# Patient Record
Sex: Female | Born: 1985 | Hispanic: Yes | Marital: Married | Smoking: Never smoker
Health system: Southern US, Community
[De-identification: ages and names within clinical notes are randomized; demographics above are authoritative.]

## PROBLEM LIST (undated history)

## (undated) ENCOUNTER — Inpatient Hospital Stay (HOSPITAL_COMMUNITY): Payer: Self-pay

## (undated) DIAGNOSIS — O88212 Thromboembolism in pregnancy, second trimester: Secondary | ICD-10-CM

## (undated) DIAGNOSIS — D6851 Activated protein C resistance: Secondary | ICD-10-CM

## (undated) HISTORY — DX: Activated protein C resistance: D68.51

## (undated) HISTORY — PX: NO PAST SURGERIES: SHX2092

## (undated) HISTORY — PX: INCISE AND DRAIN ABCESS: PRO64

---

## 2012-06-02 DIAGNOSIS — O88212 Thromboembolism in pregnancy, second trimester: Secondary | ICD-10-CM

## 2012-06-02 HISTORY — DX: Thromboembolism in pregnancy, second trimester: O88.212

## 2012-12-12 ENCOUNTER — Encounter (HOSPITAL_COMMUNITY): Payer: Self-pay | Admitting: *Deleted

## 2012-12-12 ENCOUNTER — Inpatient Hospital Stay (HOSPITAL_COMMUNITY)
Admission: AD | Admit: 2012-12-12 | Discharge: 2012-12-12 | Disposition: A | Discharge status: Home / Self Care | Payer: BC Managed Care – PPO | Source: Ambulatory Visit | Attending: Family Medicine | Admitting: Family Medicine

## 2012-12-12 DIAGNOSIS — O9989 Other specified diseases and conditions complicating pregnancy, childbirth and the puerperium: Secondary | ICD-10-CM

## 2012-12-12 DIAGNOSIS — O99891 Other specified diseases and conditions complicating pregnancy: Secondary | ICD-10-CM | POA: Insufficient documentation

## 2012-12-12 DIAGNOSIS — N949 Unspecified condition associated with female genital organs and menstrual cycle: Secondary | ICD-10-CM | POA: Insufficient documentation

## 2012-12-12 DIAGNOSIS — N898 Other specified noninflammatory disorders of vagina: Secondary | ICD-10-CM

## 2012-12-12 LAB — URINALYSIS, ROUTINE W REFLEX MICROSCOPIC
Nitrite: NEGATIVE
Protein, ur: NEGATIVE mg/dL
Specific Gravity, Urine: >1.03 — ABNORMAL HIGH (ref 1.005–1.030)
Urobilinogen, UA: 0.2 mg/dL (ref 0.0–1.0)

## 2012-12-12 LAB — URINE MICROSCOPIC-ADD ON

## 2012-12-12 LAB — GC/CHLAMYDIA PROBE AMP: CT Probe RNA: NEGATIVE

## 2012-12-12 NOTE — MAU Note (Signed)
Pt reports she had intercourse last night  noticed a small amount of vaginal bleeding when she wiped. Had a small amount this morning. Denies pain or cramping.

## 2012-12-12 NOTE — MAU Provider Note (Signed)
History     CSN: [id number]  Arrival date and time: 12/12/12 4098   None     Chief Complaint  Patient presents with  . Vaginal Bleeding   HPI 27 y.o. G1P0 at [redacted]w[redacted]d with vaginal discharge. Originally stated to interpreter that she had "spotting" last night after intercourse and again with wiping this morning, small amount of low back pain. When I interviewed her with interpreter, however, she denies any bleeding, just a small amount of "watery discharge" after intercourse last night and this morning. Asked multiple times and patient denies any bleeding. Was seen by [facility] on Friday for vaginal discharge, had a negative wet prep at that time, but did not have a GC/CT. This is where she is getting prenatal care.   Past Medical History  Diagnosis Date  . Medical history non-contributory     Past Surgical History  Procedure Laterality Date  . No past surgeries      No family history on file.  History  Substance Use Topics  . Smoking status: Never Smoker   . Smokeless tobacco: Not on file  . Alcohol Use: No    Allergies: No Known Allergies  No prescriptions prior to admission    Review of Systems  Constitutional: Negative.   Respiratory: Negative.   Cardiovascular: Negative.   Gastrointestinal: Negative for nausea, vomiting, abdominal pain, diarrhea and constipation.  Genitourinary: Negative for dysuria, urgency, frequency, hematuria and flank pain.       Negative for vaginal bleeding, + discharge   Musculoskeletal: Negative.   Neurological: Negative.   Psychiatric/Behavioral: Negative.    Physical Exam   Blood pressure 102/53, pulse 81, temperature 97 F (36.1 C), temperature source Oral, resp. rate 18, height 5\' 3"  (1.6 m), weight 117 lb 12.8 oz (53.434 kg), last menstrual period 09/12/2012.  Physical Exam  Nursing note and vitals reviewed. Constitutional: She is oriented to person, place, and time. She appears well-developed and  well-nourished. No distress.  Cardiovascular: Normal rate.   Respiratory: Effort normal.  GI: Soft. There is no tenderness.  Genitourinary: Vaginal discharge (light brown mucous) found.  Cervix closed   Musculoskeletal: Normal range of motion.  Neurological: She is alert and oriented to person, place, and time.  Skin: Skin is warm and dry.  Psychiatric: She has a normal mood and affect.    MAU Course  Procedures  Results for orders placed during the hospital encounter of 12/12/12 (from the past 24 hour(s))  URINALYSIS, ROUTINE W REFLEX MICROSCOPIC     Status: Abnormal   Collection Time    12/12/12  7:20 AM      Result Value Range   Color, Urine YELLOW  YELLOW   APPearance CLEAR  CLEAR   Specific Gravity, Urine >1.030 (*) 1.005 - 1.030   pH 5.5  5.0 - 8.0   Glucose, UA NEGATIVE  NEGATIVE mg/dL   Hgb urine dipstick LARGE (*) NEGATIVE   Bilirubin Urine NEGATIVE  NEGATIVE   Ketones, ur NEGATIVE  NEGATIVE mg/dL   Protein, ur NEGATIVE  NEGATIVE mg/dL   Urobilinogen, UA 0.2  0.0 - 1.0 mg/dL   Nitrite NEGATIVE  NEGATIVE   Leukocytes, UA SMALL (*) NEGATIVE  URINE MICROSCOPIC-ADD ON     Status: Abnormal   Collection Time    12/12/12  7:20 AM      Result Value Range   Squamous Epithelial / LPF MANY (*) RARE   WBC, UA 3-6  <3 WBC/hpf   RBC / HPF  0-2  <3 RBC/hpf   Bacteria, UA FEW (*) RARE   Crystals CA OXALATE CRYSTALS (*) NEGATIVE   Urine-Other MUCOUS PRESENT      Assessment and Plan   1. Vaginal discharge in pregnancy in second trimester       Medication List         prenatal multivitamin Tabs  Take 1 tablet by mouth daily at 12 noon.            Follow-up Information   Follow up with [facility]. (as scheduled for prenatal care)    Contact information:   [address] [place] [address] [phone]        FRAZIER,NATALIE 12/12/2012, 8:28 AM

## 2012-12-13 LAB — URINE CULTURE: Colony Count: 40000

## 2013-01-19 ENCOUNTER — Other Ambulatory Visit: Payer: Self-pay

## 2013-03-17 ENCOUNTER — Other Ambulatory Visit (HOSPITAL_COMMUNITY): Payer: Self-pay | Admitting: Obstetrics and Gynecology

## 2013-03-17 DIAGNOSIS — O09529 Supervision of elderly multigravida, unspecified trimester: Secondary | ICD-10-CM

## 2013-03-18 ENCOUNTER — Ambulatory Visit (HOSPITAL_COMMUNITY)
Admission: RE | Admit: 2013-03-18 | Discharge: 2013-03-18 | Disposition: A | Discharge status: Home / Self Care | Payer: BC Managed Care – PPO | Source: Ambulatory Visit | Attending: Obstetrics and Gynecology | Admitting: Obstetrics and Gynecology

## 2013-03-18 ENCOUNTER — Encounter (HOSPITAL_COMMUNITY): Payer: Self-pay

## 2013-03-18 DIAGNOSIS — O358XX0 Maternal care for other (suspected) fetal abnormality and damage, not applicable or unspecified: Secondary | ICD-10-CM | POA: Insufficient documentation

## 2013-03-18 DIAGNOSIS — O2230 Deep phlebothrombosis in pregnancy, unspecified trimester: Secondary | ICD-10-CM | POA: Insufficient documentation

## 2013-03-18 DIAGNOSIS — I82409 Acute embolism and thrombosis of unspecified deep veins of unspecified lower extremity: Secondary | ICD-10-CM | POA: Insufficient documentation

## 2013-03-18 DIAGNOSIS — O2232 Deep phlebothrombosis in pregnancy, second trimester: Secondary | ICD-10-CM

## 2013-03-18 NOTE — Progress Notes (Signed)
MATERNAL FETAL MEDICINE CONSULT  Patient Name: Sherry Houston Medical Record Number:  [id number] Date of Birth: 03/21/1986 Requesting Physician Name:  Dahlia Bailiff, MD Date of Service: 03/18/2013  Chief Complaint DVT  History of Present Illness Sherry Houston was seen today for prenatal diagnosis secondary to DVT at the request of Dr. Dahlia Bailiff.  The patient is a 27 y.o. G1P0,at [redacted]w[redacted]d per second trimester ultrasound.  Sherry Houston was diagnosed with a Left lower extremity DVT on 9/30 after having a several day history of pain and swelling in that extremity.  She had Doppler studies and was diagnosed with a DVT and started on therapeutic lovenox.  (1 mg/kg q 12 hrs- currently on 60 mg each dose).  She comes in today for a consultation about DVT in pregnancy. She reports continued pain in her left leg that is less than previously and more sore.  Review of Systems Pertinent items are noted in HPI.  Patient History OB History  Gravida Para Term Preterm AB SAB TAB Ectopic Multiple Living  1             # Outcome Date GA Lbr Len/2nd Weight Sex Delivery Anes PTL Lv  1 CUR               History reviewed. No pertinent past medical history.  History reviewed. No pertinent past surgical history.  History   Social History  . Marital Status: Married    Spouse Name: N/A    Number of Children: N/A  . Years of Education: N/A   Social History Main Topics  . Smoking status: Never Smoker   . Smokeless tobacco: None  . Alcohol Use: No  . Drug Use: No  . Sexual Activity: Yes   Other Topics Concern  . None   Social History Narrative  . None    History reviewed. No pertinent family history.No family history of DVT. In addition, the patient has no family history of mental retardation, birth defects, or genetic diseases.  Physical Examination There were no vitals filed for this visit. General appearance - alert, well appearing, and in no distress  Assessment and Recommendations 1.  DVT-  Sherry Houston was counseled that she will need to be on therapeutic anticoagulation at least throughout her pregnancy and the following 6 weeks postpartum.  She is currently on Lovenox.  She will need a timed delivery/induction of labor at 39 weeks so as to be able to transition her from the lovenox (needs to be discontinued 24 hrs prior to labor) to a heparin drip until she gets to ~4cm, active labor at which point the drip can be turned off (desired to be off 4-6 hours prior to delivery, A neuraxial catheter may be placed when the aPTT has returned to normal).  Anticoagulation will need to be resumed 6 hours after a vaginal delivery, 12 hours after a cesarean.  Ted hose should be worn during labor.  Sherry Houston should have a third trimester Anesthesia consultation as well.   In the meantime, Sherry Houston should have a workup for inherited/acquired thrombophilias to include: Factor V Leiden, antithrombin, prothrombin gene mutations, lupus anticoagulant, anticardiolipin antibodies, and beta 2 glycoprotein.    Lovenox dosing should be monitored to assure therapeutic levels are maintained.  Anti Xa levels should be drawn 4 hrs after the 4th dose of lovenox and then monthly thereafter.  A therapeutic level is between 0.5-1.2.  Sherry Houston mentioned possibly being cared for with her prenatal care with high risk  MFM at [facility].  If this is your desire then she could transfer to the faculty practice at [facility] or if you'd like MFM care for her she would be welcome at the [facility] MFM office in [place].  We are also very happy to assist you in management of her care should that be the plan.    Thank you for this opportunity to consult on your patient. This plan was discussed with Dr. Derinda Sis who is in agreement with the above.   45 minutes was spent with the patient >50% of which was devoted to face to face counseling on the above.   Molly Maduro, MD MFM Fellow

## 2013-03-18 NOTE — Addendum Note (Signed)
Encounter addended by: Alessandra Bevels. Chase Picket, RN on: 03/18/2013  5:33 PM<BR>     Documentation filed: Episodes

## 2013-03-21 ENCOUNTER — Ambulatory Visit (HOSPITAL_COMMUNITY)
Admission: RE | Admit: 2013-03-21 | Discharge: 2013-03-21 | Disposition: A | Discharge status: Home / Self Care | Payer: BC Managed Care – PPO | Source: Ambulatory Visit | Attending: Obstetrics and Gynecology | Admitting: Obstetrics and Gynecology

## 2013-03-21 VITALS — BP 128/76 | HR 125 | Wt 134.0 lb

## 2013-03-21 DIAGNOSIS — Z1389 Encounter for screening for other disorder: Secondary | ICD-10-CM | POA: Insufficient documentation

## 2013-03-21 DIAGNOSIS — O09529 Supervision of elderly multigravida, unspecified trimester: Secondary | ICD-10-CM

## 2013-03-21 DIAGNOSIS — O2232 Deep phlebothrombosis in pregnancy, second trimester: Secondary | ICD-10-CM

## 2013-03-21 DIAGNOSIS — Z363 Encounter for antenatal screening for malformations: Secondary | ICD-10-CM | POA: Insufficient documentation

## 2013-03-21 DIAGNOSIS — I82409 Acute embolism and thrombosis of unspecified deep veins of unspecified lower extremity: Secondary | ICD-10-CM | POA: Insufficient documentation

## 2013-03-21 DIAGNOSIS — O358XX0 Maternal care for other (suspected) fetal abnormality and damage, not applicable or unspecified: Secondary | ICD-10-CM | POA: Insufficient documentation

## 2013-03-21 NOTE — Progress Notes (Signed)
Sherry Houston  was seen today for an ultrasound appointment.  See full report in [facility].  Impression: Single IUP at 25 1/7 weeks Recent diagnosis of DVT Normal fetal anatomic survey Fetal growth is appropriate (64th %tile) Normal amniotic fluid volume  Recommendations: Recommend follow-up ultrasound examination in 4 weeks for interval growth Recommend monthyl anti-Xa levels while on therapeutic Lovinox.  Alpha Gula, MD

## 2013-04-05 NOTE — Addendum Note (Signed)
Encounter addended by: Alessandra Bevels. Chase Picket, RN on: 04/05/2013 10:55 AM<BR>     Documentation filed: Charges VN

## 2013-04-11 ENCOUNTER — Encounter (HOSPITAL_COMMUNITY): Payer: Self-pay | Admitting: *Deleted

## 2013-04-20 ENCOUNTER — Other Ambulatory Visit (HOSPITAL_COMMUNITY): Payer: Self-pay | Admitting: Obstetrics and Gynecology

## 2013-04-20 ENCOUNTER — Encounter (HOSPITAL_COMMUNITY): Payer: Self-pay

## 2013-04-20 ENCOUNTER — Ambulatory Visit (HOSPITAL_COMMUNITY)
Admission: RE | Admit: 2013-04-20 | Discharge: 2013-04-20 | Disposition: A | Discharge status: Home / Self Care | Payer: BC Managed Care – PPO | Source: Ambulatory Visit | Attending: Obstetrics and Gynecology | Admitting: Obstetrics and Gynecology

## 2013-04-20 DIAGNOSIS — I82409 Acute embolism and thrombosis of unspecified deep veins of unspecified lower extremity: Secondary | ICD-10-CM | POA: Insufficient documentation

## 2013-04-20 DIAGNOSIS — O2232 Deep phlebothrombosis in pregnancy, second trimester: Secondary | ICD-10-CM

## 2013-04-20 DIAGNOSIS — O09529 Supervision of elderly multigravida, unspecified trimester: Secondary | ICD-10-CM

## 2013-05-18 ENCOUNTER — Encounter (HOSPITAL_COMMUNITY): Payer: Self-pay

## 2013-05-18 ENCOUNTER — Ambulatory Visit (HOSPITAL_COMMUNITY)
Admission: RE | Admit: 2013-05-18 | Discharge: 2013-05-18 | Disposition: A | Discharge status: Home / Self Care | Payer: BC Managed Care – PPO | Source: Ambulatory Visit | Attending: Obstetrics and Gynecology | Admitting: Obstetrics and Gynecology

## 2013-05-18 ENCOUNTER — Other Ambulatory Visit (HOSPITAL_COMMUNITY): Payer: Self-pay | Admitting: Obstetrics and Gynecology

## 2013-05-18 VITALS — BP 114/52 | HR 110 | Wt 149.0 lb

## 2013-05-18 DIAGNOSIS — I82409 Acute embolism and thrombosis of unspecified deep veins of unspecified lower extremity: Secondary | ICD-10-CM | POA: Insufficient documentation

## 2013-05-18 DIAGNOSIS — O2233 Deep phlebothrombosis in pregnancy, third trimester: Secondary | ICD-10-CM

## 2013-05-19 ENCOUNTER — Other Ambulatory Visit (HOSPITAL_COMMUNITY): Payer: Self-pay | Admitting: Obstetrics and Gynecology

## 2013-05-19 DIAGNOSIS — I82409 Acute embolism and thrombosis of unspecified deep veins of unspecified lower extremity: Secondary | ICD-10-CM

## 2013-05-25 ENCOUNTER — Ambulatory Visit (HOSPITAL_COMMUNITY): Payer: BC Managed Care – PPO

## 2013-05-25 ENCOUNTER — Ambulatory Visit (HOSPITAL_COMMUNITY)
Admission: RE | Admit: 2013-05-25 | Discharge: 2013-05-25 | Disposition: A | Discharge status: Home / Self Care | Payer: BC Managed Care – PPO | Source: Ambulatory Visit | Attending: Obstetrics and Gynecology | Admitting: Obstetrics and Gynecology

## 2013-05-25 DIAGNOSIS — I82409 Acute embolism and thrombosis of unspecified deep veins of unspecified lower extremity: Secondary | ICD-10-CM | POA: Insufficient documentation

## 2013-05-31 ENCOUNTER — Other Ambulatory Visit: Payer: Self-pay | Admitting: Obstetrics and Gynecology

## 2013-05-31 DIAGNOSIS — I82409 Acute embolism and thrombosis of unspecified deep veins of unspecified lower extremity: Secondary | ICD-10-CM

## 2013-06-01 ENCOUNTER — Other Ambulatory Visit (HOSPITAL_COMMUNITY): Payer: Self-pay | Admitting: Obstetrics and Gynecology

## 2013-06-01 ENCOUNTER — Other Ambulatory Visit: Payer: Self-pay | Admitting: Obstetrics and Gynecology

## 2013-06-01 ENCOUNTER — Ambulatory Visit (HOSPITAL_COMMUNITY)
Admission: RE | Admit: 2013-06-01 | Discharge: 2013-06-01 | Disposition: A | Discharge status: Home / Self Care | Payer: BC Managed Care – PPO | Source: Ambulatory Visit | Attending: Obstetrics and Gynecology | Admitting: Obstetrics and Gynecology

## 2013-06-01 ENCOUNTER — Encounter (HOSPITAL_COMMUNITY): Payer: Self-pay

## 2013-06-01 VITALS — BP 110/67 | HR 102 | Wt 152.5 lb

## 2013-06-01 DIAGNOSIS — O2231 Deep phlebothrombosis in pregnancy, first trimester: Secondary | ICD-10-CM

## 2013-06-01 DIAGNOSIS — I82409 Acute embolism and thrombosis of unspecified deep veins of unspecified lower extremity: Secondary | ICD-10-CM

## 2013-06-08 ENCOUNTER — Other Ambulatory Visit (HOSPITAL_COMMUNITY): Payer: Self-pay | Admitting: Obstetrics and Gynecology

## 2013-06-08 ENCOUNTER — Ambulatory Visit (HOSPITAL_COMMUNITY)
Admission: RE | Admit: 2013-06-08 | Discharge: 2013-06-08 | Disposition: A | Discharge status: Home / Self Care | Payer: BC Managed Care – PPO | Source: Ambulatory Visit | Attending: Obstetrics and Gynecology | Admitting: Obstetrics and Gynecology

## 2013-06-08 DIAGNOSIS — O2230 Deep phlebothrombosis in pregnancy, unspecified trimester: Principal | ICD-10-CM

## 2013-06-08 DIAGNOSIS — I82409 Acute embolism and thrombosis of unspecified deep veins of unspecified lower extremity: Secondary | ICD-10-CM

## 2013-06-15 ENCOUNTER — Ambulatory Visit (HOSPITAL_COMMUNITY)
Admission: RE | Admit: 2013-06-15 | Discharge: 2013-06-15 | Disposition: A | Discharge status: Home / Self Care | Payer: BC Managed Care – PPO | Source: Ambulatory Visit | Attending: Obstetrics and Gynecology | Admitting: Obstetrics and Gynecology

## 2013-06-15 DIAGNOSIS — O2230 Deep phlebothrombosis in pregnancy, unspecified trimester: Principal | ICD-10-CM

## 2013-06-15 DIAGNOSIS — I82409 Acute embolism and thrombosis of unspecified deep veins of unspecified lower extremity: Secondary | ICD-10-CM

## 2013-06-22 ENCOUNTER — Ambulatory Visit (HOSPITAL_COMMUNITY)
Admission: RE | Admit: 2013-06-22 | Discharge: 2013-06-22 | Disposition: A | Discharge status: Home / Self Care | Payer: BC Managed Care – PPO | Source: Ambulatory Visit | Attending: Obstetrics and Gynecology | Admitting: Obstetrics and Gynecology

## 2013-06-22 DIAGNOSIS — I82409 Acute embolism and thrombosis of unspecified deep veins of unspecified lower extremity: Secondary | ICD-10-CM | POA: Insufficient documentation

## 2013-06-22 DIAGNOSIS — Z3689 Encounter for other specified antenatal screening: Secondary | ICD-10-CM | POA: Insufficient documentation

## 2013-06-22 DIAGNOSIS — O2230 Deep phlebothrombosis in pregnancy, unspecified trimester: Principal | ICD-10-CM

## 2013-09-16 ENCOUNTER — Inpatient Hospital Stay (HOSPITAL_COMMUNITY)
Admission: AD | Admit: 2013-09-16 | Discharge: 2013-09-16 | Disposition: A | Discharge status: Home / Self Care | Payer: BC Managed Care – PPO | Source: Ambulatory Visit | Attending: Family Medicine | Admitting: Family Medicine

## 2013-09-16 ENCOUNTER — Encounter (HOSPITAL_COMMUNITY): Payer: Self-pay | Admitting: *Deleted

## 2013-09-16 DIAGNOSIS — O9113 Abscess of breast associated with lactation: Secondary | ICD-10-CM

## 2013-09-16 DIAGNOSIS — O9112 Abscess of breast associated with the puerperium: Secondary | ICD-10-CM | POA: Insufficient documentation

## 2013-09-16 LAB — CBC
HCT: 37.8 % (ref 36.0–46.0)
Hemoglobin: 12.6 g/dL (ref 12.0–15.0)
MCH: 27.3 pg (ref 26.0–34.0)
MCHC: 33.3 g/dL (ref 30.0–36.0)
MCV: 82 fL (ref 78.0–100.0)
PLATELETS: 333 10*3/uL (ref 150–400)
RBC: 4.61 MIL/uL (ref 3.87–5.11)
RDW: 14.6 % (ref 11.5–15.5)
WBC: 12.8 10*3/uL — ABNORMAL HIGH (ref 4.0–10.5)

## 2013-09-16 LAB — URINALYSIS, ROUTINE W REFLEX MICROSCOPIC
Bilirubin Urine: NEGATIVE
GLUCOSE, UA: NEGATIVE mg/dL
KETONES UR: NEGATIVE mg/dL
Nitrite: NEGATIVE
PH: 5.5 (ref 5.0–8.0)
Protein, ur: NEGATIVE mg/dL
Specific Gravity, Urine: <1.005 — ABNORMAL LOW (ref 1.005–1.030)
Urobilinogen, UA: 0.2 mg/dL (ref 0.0–1.0)

## 2013-09-16 LAB — POCT PREGNANCY, URINE: Preg Test, Ur: NEGATIVE

## 2013-09-16 LAB — URINE MICROSCOPIC-ADD ON

## 2013-09-16 MED ORDER — SODIUM CHLORIDE 0.9 % IV SOLN
INTRAVENOUS | Status: DC
  Administered 2013-09-16: 22:00:00 via INTRAVENOUS

## 2013-09-16 MED ORDER — CLINDAMYCIN PHOSPHATE 900 MG/50ML IV SOLN
900.0000 mg | Freq: Once | INTRAVENOUS | Status: AC
  Administered 2013-09-16: 900 mg via INTRAVENOUS
  Filled 2013-09-16: qty 50

## 2013-09-16 MED ORDER — OXYCODONE-ACETAMINOPHEN 5-325 MG PO TABS: 1.0000 | ORAL_TABLET | ORAL | Status: DC | PRN

## 2013-09-16 MED ORDER — FENTANYL CITRATE 0.05 MG/ML IJ SOLN
100.0000 ug | INTRAMUSCULAR | Status: DC | PRN
Start: 2013-09-16 — End: 2013-09-17
  Administered 2013-09-16: 100 ug via INTRAVENOUS

## 2013-09-16 MED ORDER — FENTANYL CITRATE 0.05 MG/ML IJ SOLN
INTRAMUSCULAR | Status: AC
  Filled 2013-09-16: qty 2

## 2013-09-16 MED ORDER — CLINDAMYCIN HCL 300 MG PO CAPS: 300.0000 mg | ORAL_CAPSULE | Freq: Four times a day (QID) | ORAL | Status: AC

## 2013-09-16 NOTE — MAU Provider Note (Signed)
Chief Complaint: No chief complaint on file.  First Provider Initiated Contact with Patient 09/16/13 2050     SUBJECTIVE HPI: Sherry Houston is a 28 y.o. G1P1001 at 2.5 months PP who presents with a painful right breast mass. Sx started ~1 month ago when baby went on a nursing strike. Pt has been trying to pump, but has not been able to get an y milk out of the right breast. 1 week ago pain worsenign and the pt started having body aches and chills. Didn't check temp. Saw OB in [place]. Rx Keflex 750 PO TID x 10 days. Has taken as directed. Body aches, chills resolved but breast is no better.   Took Ibuprofen 800 mg at 1630.   Past Medical History  Diagnosis Date  . Medical history non-contributory    OB History  Gravida Para Term Preterm AB SAB TAB Ectopic Multiple Living  1 1 1       1     # Outcome Date GA Lbr Len/2nd Weight Sex Delivery Anes PTL Lv  1 TRM 07/01/13    F SVD Local  Y     Comments: System Generated. Please review and update pregnancy details.     Past Surgical History  Procedure Laterality Date  . No past surgeries     History   Social History  . Marital Status: Married    Spouse Name: N/A    Number of Children: N/A  . Years of Education: N/A   Occupational History  . Not on file.   Social History Main Topics  . Smoking status: Never Smoker   . Smokeless tobacco: Never Used  . Alcohol Use: No  . Drug Use: No  . Sexual Activity: Yes    Birth Control/ Protection: Condom   Other Topics Concern  . Not on file   Social History Narrative   ** Merged History Encounter **       No current facility-administered medications on file prior to encounter.   Current Outpatient Prescriptions on File Prior to Encounter  Medication Sig Dispense Refill  . Prenatal Vit-Fe Fumarate-FA (PRENATAL MULTIVITAMIN) TABS Take 1 tablet by mouth daily at 12 noon.      . enoxaparin (LOVENOX) 60 MG/0.6ML injection Inject 60 mg into the skin every 12 (twelve) hours.       No  Known Allergies  ROS: Pertinent items in HPI.   OBJECTIVE Blood pressure 107/48, pulse 66, temperature 98.2 F (36.8 C), temperature source Oral, resp. rate 18, height 5' 1.5" (1.562 m), weight 54.522 kg (120 lb 3.2 oz), last menstrual period 09/12/2012, SpO2 100.00%, unknown if currently breastfeeding. GENERAL: Well-developed, well-nourished female in no acute distress.  HEENT: Normocephalic HEART: normal rate RESP: normal effort BREAST: Right breast w/ 8x10 cm erythematous, tender, fluctuant mass from 10 o'clock to 2 o'clock. No drainage or bleeding.  NEURO: Alert and oriented  LAB RESULTS Results for orders placed during the hospital encounter of 09/16/13 (from the past 24 hour(s))  URINALYSIS, ROUTINE W REFLEX MICROSCOPIC     Status: Abnormal   Collection Time    09/16/13  7:37 PM      Result Value Ref Range   Color, Urine STRAW (*) YELLOW   APPearance HAZY (*) CLEAR   Specific Gravity, Urine <1.005 (*) 1.005 - 1.030   pH 5.5  5.0 - 8.0   Glucose, UA NEGATIVE  NEGATIVE mg/dL   Hgb urine dipstick SMALL (*) NEGATIVE   Bilirubin Urine NEGATIVE  NEGATIVE   Ketones,  ur NEGATIVE  NEGATIVE mg/dL   Protein, ur NEGATIVE  NEGATIVE mg/dL   Urobilinogen, UA 0.2  0.0 - 1.0 mg/dL   Nitrite NEGATIVE  NEGATIVE   Leukocytes, UA MODERATE (*) NEGATIVE  URINE MICROSCOPIC-ADD ON     Status: Abnormal   Collection Time    09/16/13  7:37 PM      Result Value Ref Range   Squamous Epithelial / LPF FEW (*) RARE   WBC, UA 11-20  <3 WBC/hpf   RBC / HPF 0-2  <3 RBC/hpf   Bacteria, UA FEW (*) RARE  POCT PREGNANCY, URINE     Status: None   Collection Time    09/16/13  7:48 PM      Result Value Ref Range   Preg Test, Ur NEGATIVE  NEGATIVE  CBC     Status: Abnormal   Collection Time    09/16/13  8:22 PM      Result Value Ref Range   WBC 12.8 (*) 4.0 - 10.5 K/uL   RBC 4.61  3.87 - 5.11 MIL/uL   Hemoglobin 12.6  12.0 - 15.0 g/dL   HCT 04.537.8  40.936.0 - 81.146.0 %   MCV 82.0  78.0 - 100.0 fL   MCH 27.3   26.0 - 34.0 pg   MCHC 33.3  30.0 - 36.0 g/dL   RDW 91.414.6  78.211.5 - 95.615.5 %   Platelets 333  150 - 400 K/uL   IMAGING No results found.  MAU COURSE Consulted w/ Dr. Shawnie PonsPratt RE: concern for abscess. Agrees. Consulted General Surgeon Dr. Luisa Hartornett. Will come see pt in MAU. Ordered Clinda IV.   I and D performed by Dr. Luisa Hartornett. See note.   ASSESSMENT 1. Abscess of breast associated with lactation     PLAN Discharge home in stable condition.  After care instructions reviewed.      Follow-up Information   Follow up with [facility] On 09/20/2013. (will call you to schedule appointment)    Contact information:   [address] [place] [address] [phone]      Follow up with [facility]. (As needed in emergencies (fever >100.4, severe pain or heavy bleeding))    Contact information:   [address] [place] [id number]        Medication List    STOP taking these medications       cephALEXin 750 MG capsule  Commonly known as:  KEFLEX     enoxaparin 60 MG/0.6ML injection  Commonly known as:  LOVENOX      TAKE these medications       clindamycin 300 MG capsule  Commonly known as:  CLEOCIN  Take 1 capsule (300 mg total) by mouth 4 (four) times daily.     ibuprofen 800 MG tablet  Commonly known as:  ADVIL,MOTRIN  Take 800 mg by mouth every 8 (eight) hours as needed.     oxyCODONE-acetaminophen 5-325 MG per tablet  Commonly known as:  PERCOCET/ROXICET  Take 1-2 tablets by mouth every 4 (four) hours as needed.     prenatal multivitamin Tabs tablet  Take 1 tablet by mouth daily at 12 noon.         [place] Merrillyn Ackerley, [place] 09/16/2013  10:31 PM

## 2013-09-16 NOTE — MAU Note (Signed)
About 1.425months after baby was born baby started rejecting breast milk and my right breast is hard. Last Weds i saw my ob/gyn and given antibiotics. Right breast hurts, is red and swollen. Baby is not breastfeeding anymore but i am pumping but no milk is coming out and breasts are firm. Hx L DVT in 5th month of pregnancy. Took Lovenox until 40 days after delivery and now everything ok with leg

## 2013-09-16 NOTE — Consult Note (Signed)
Reason for Consult:right breast abscess  Referring Physician: Shawnie PonsPratt MD  Sherry FeeEsther Solis Houston is an 28 y.o. female.  HPI: Asked to see pt for right breast abscess for 8 days.  Pt is breast feeding intermittently.  Pt has redness,  Warmth and tenderness of right breast at nipple.  She has been on antibiotics.  Not better. Translator used to communicate  For spanish.   Past Medical History  Diagnosis Date  . Medical history non-contributory     Past Surgical History  Procedure Laterality Date  . No past surgeries      History reviewed. No pertinent family history.  Social History:  reports that she has never smoked. She has never used smokeless tobacco. She reports that she does not drink alcohol or use illicit drugs.  Allergies: No Known Allergies  Medications: I have reviewed the patient's current medications.  Results for orders placed during the hospital encounter of 09/16/13 (from the past 48 hour(s))  URINALYSIS, ROUTINE W REFLEX MICROSCOPIC     Status: Abnormal   Collection Time    09/16/13  7:37 PM      Result Value Ref Range   Color, Urine STRAW (*) YELLOW   APPearance HAZY (*) CLEAR   Specific Gravity, Urine <1.005 (*) 1.005 - 1.030   pH 5.5  5.0 - 8.0   Glucose, UA NEGATIVE  NEGATIVE mg/dL   Hgb urine dipstick SMALL (*) NEGATIVE   Bilirubin Urine NEGATIVE  NEGATIVE   Ketones, ur NEGATIVE  NEGATIVE mg/dL   Protein, ur NEGATIVE  NEGATIVE mg/dL   Urobilinogen, UA 0.2  0.0 - 1.0 mg/dL   Nitrite NEGATIVE  NEGATIVE   Leukocytes, UA MODERATE (*) NEGATIVE  URINE MICROSCOPIC-ADD ON     Status: Abnormal   Collection Time    09/16/13  7:37 PM      Result Value Ref Range   Squamous Epithelial / LPF FEW (*) RARE   WBC, UA 11-20  <3 WBC/hpf   RBC / HPF 0-2  <3 RBC/hpf   Bacteria, UA FEW (*) RARE  POCT PREGNANCY, URINE     Status: None   Collection Time    09/16/13  7:48 PM      Result Value Ref Range   Preg Test, Ur NEGATIVE  NEGATIVE   Comment:            THE  SENSITIVITY OF THIS     METHODOLOGY IS >24 mIU/mL  CBC     Status: Abnormal   Collection Time    09/16/13  8:22 PM      Result Value Ref Range   WBC 12.8 (*) 4.0 - 10.5 K/uL   RBC 4.61  3.87 - 5.11 MIL/uL   Hemoglobin 12.6  12.0 - 15.0 g/dL   HCT 21.337.8  08.636.0 - 57.846.0 %   MCV 82.0  78.0 - 100.0 fL   MCH 27.3  26.0 - 34.0 pg   MCHC 33.3  30.0 - 36.0 g/dL   RDW 46.914.6  62.911.5 - 52.815.5 %   Platelets 333  150 - 400 K/uL    No results found.  Review of Systems  Constitutional: Negative for fever and chills.  Eyes: Negative.   Respiratory: Negative.   Cardiovascular: Negative.   Gastrointestinal: Negative.   Genitourinary: Negative.   Musculoskeletal: Negative.   Skin: Negative.   Endo/Heme/Allergies: Negative.   Psychiatric/Behavioral: Negative.    Blood pressure 107/48, pulse 66, temperature 98.2 F (36.8 C), temperature source Oral, resp. rate 18, height 5'  1.5" (1.562 m), weight 120 lb 3.2 oz (54.522 kg), last menstrual period 09/12/2012, SpO2 100.00%, unknown if currently breastfeeding. Physical Exam  Constitutional: She is oriented to person, place, and time. She appears well-developed and well-nourished.  Respiratory: Right breast exhibits mass, nipple discharge and tenderness.    Musculoskeletal: Normal range of motion.  Neurological: She is alert and oriented to person, place, and time.  Skin: Skin is warm and dry.  Psychiatric: She has a normal mood and affect. Her behavior is normal. Judgment and thought content normal.    Assessment/Plan: Abscess right breast  Recommend incision and drainage at bedside. Translator communicated this to patient and she agrees to proceed.The procedure has been discussed with the patient.  Alternative therapies have been discussed with the patient.  Operative risks include bleeding,  Infection,  Organ injury,  Nerve injury,  Blood vessel injury,  DVT,  Pulmonary embolism,  Death,  And possible reoperation.  Medical management risks include  worsening of present situation.  The success of the procedure is 50 -90 % at treating patients symptoms.  The patient understands and agrees to proceed.    Under sterile conditions the right  Breast was prepped and draped in a sterile fashion.  1 cm incision made and copious pus drained. Packed with 1/4 in packing.  Dry dressing applied.  To be discharge on clindamycin 300 mg po tid for 10 days and keep packing in place and return on tues to [facility] for follow up.   Ebonee Stober A. Fread Kottke 09/16/2013, 10:38 PM

## 2013-09-16 NOTE — Discharge Instructions (Signed)
Keep the dressing on until tomorrow, Then change dressing 2 or 3 times per day until your follow-up appointment.   Absceso (Abscess)  Un absceso es una zona infectada que contiene pus y desechos.Puede aparecer en cualquier parte del cuerpo. Tambin se lo conoce como fornculo o divieso. CAUSAS  Ocurre cuando los tejidos se infectan. Tambin puede formarse por obstruccin de las glndulas sebceas o las glndulas sudorparas, infeccin de los folculos pilosos o por una lesin pequea en la piel. A medida que el organismo lucha contra la infeccin, se acumula pus en la zona y hace presin debajo de la piel. Esta presin causa dolor. Las personas con un sistema inmunolgico debilitado tienen dificultad para Industrial/product designerluchar contra las infecciones y pueden formar abscesos con ms frecuencia.  SNTOMAS  Generalmente un absceso se forma sobre la piel y se vuelve una masa dolorosa, roja, caliente y sensible. Si se forma debajo de la piel, podr sentir como una zona blanda, que se Faisonmueve, debajo de la piel. Algunos abscesos se abren (ruptura) por s mismos, pero la mayora seguir empeorando si no se lo trata. La infeccin puede diseminarse hacia otros sitios del cuerpo y finalmente al torrente sanguneo y hace que el enfermo se sienta mal.  DIAGNSTICO  El mdico le har una historia clnica y un examen fsico. Podrn tomarle Lauris Poaguna muestra de lquido del absceso y Public librariananalizarlo para Clinical research associateencontrar la causa de la infeccin. .  TRATAMIENTO  El mdico le indicar antibiticos para combatir la infeccin. Sin embargo, el uso de antibiticos solamente no curar el absceso. El mdico tendr que hacer un pequeo corte (incisin) en el absceso para drenar el pus. En algunos casos se introduce una gasa en el absceso para reducir Chief Technology Officerel dolor y que siga drenando la zona.  INSTRUCCIONES PARA EL CUIDADO EN EL HOGAR   Solo tome medicamentos de venta libre o recetados para Chief Technology Officerel dolor, Dentistmalestar o fiebre, segn las indicaciones del mdico.  Si le  han recetado antibiticos, tmelos segn las indicaciones. Tmelos todos, aunque se sienta mejor.  Si le aplicaron una gasa, siga las indicaciones del mdico para Nigeriacambiarla.  Para evitar la propagacin de la infeccin:  Mantenga el absceso cubierto con el vendaje.  Lvese bien las manos.  No comparta artculos de cuidado personal, toallas o jacuzzis con los dems.  Evite el contacto con la piel de Economistotras personas.  Mantenga la piel y la ropa limpia alrededor del absceso.  Cumpla con todas las visitas de control, segn le indique su mdico. SOLICITE ATENCIN MDICA SI:   Aumenta el dolor, la hinchazn, el enrojecimiento, drena lquido o sangra.  Siente dolores musculares, escalofros, o una sensacin general de Dentistmalestar.  Tiene fiebre. ASEGRESE DE QUE:   Comprende estas instrucciones.  Controlar su enfermedad.  Solicitar ayuda de inmediato si no mejora o si empeora. Document Released: 05/19/2005 Document Revised: 11/18/2011 [facility] Inc[facility] Patient Information 2014 [place]ExitCare, [place].  Cuidados de la incisin (Incision Care)  Una incisin es un corte quirrgico que se realiza para abrir la piel. Es necesario que cuide de la incisin. Esto ayudar a evitar las infecciones. CUIDADOS EN EL HOGAR   Slo tome los medicamentos segn le indique el mdico.  No que quite el apsito (vendaje) ni moje la incisin hasta que el mdico lo autorice. Si se moja, se ensucia o huele mal, cmbielo y comunquese con su mdico.  Puede tomar [place]una ducha. No tome baos de inmersin, no practique natacin ni haga nada que moje la incisin, hasta que se cure.  Vuelva a su dieta habitual y realice las actividades que le indique o le autorice el mdico.  Evite levantar objetos hasta que el mdico lo apruebe.  Aplique un medicamento para aliviar la Best Buypicazn sobre la incisin, segn las indicaciones del mdico. No se apriete ni rasque la incisin.  Cumpla con las visitas al mdico para que le retiren los  puntos (suturas) o las grapas.  Beba gran cantidad de lquido para mantener el pis (orina) de tono claro o amarillo plido. SOLICITE AYUDA DE INMEDIATO SI:   Tiene fiebre.  Tiene una erupcin.  Se siente mareado o se desvanece (se desmaya) mientras se encontraba de pie.  Tiene dificultad para respirar.  Aparece alguna reaccin o efecto secundario por los Cardinal Healthmedicamentos que le recetaron.  Observa enrojecimiento, inflamacin, (hinchazn) o siente ms dolor en la incisin y los medicamentos no lo Jewell Ridgealivian.  Hay lquido, sangre o un material de color blanco amarillento (pus) que sale de la incisin y que dura ms de 1 da.  Tiene dolores musculares, escalofros o se siente enfermo.  Advierte un olor ftido que proviene de la incisin o del vendaje.  La incisin se abre despus de que le han retirado los The Interpublic Group of Companiespuntos las grapas o la Qatarcinta adhesiva.  Tiene Programme researcher, broadcasting/film/videomalestar estomacal (nuseas) y vmitos. ASEGRESE DE QUE:   Comprende estas instrucciones.  Controlar su enfermedad.  Solicitar ayuda de inmediato si no mejora o si empeora. Document Released: 11/18/2011 [facility] Patient Information 2014 [place] SpringsExitCare, MarylandLLC.

## 2013-09-16 NOTE — Lactation Note (Signed)
Lactation Consultation Note  Patient Name: Sherry Houston XBJYN'WToday's Date: 09/16/2013   [facility] and her baby, now 5777 days old, who was initially breastfeeding but is now rejecting the breast and mom has been applying romaine lettuce (thought this was the same as cabbage) to her breasts to dry up her milk.  She has WIC but because she has been offering formula since baby's early days of life and [facility], she is not eligible (per mom) to receive an electric breast pump.  She has used a hand pump to obtain milk and through interpreter," Derek Moundicardo", she informs LC that baby previously would breastfeed every 1-3 hours and receive formula supplement 3-4 times per day.  After 1.5 months, baby refused to latch at most attempts but sometimes would latch to her (L) breast.  Since she was seen last Wednesday (April 8), she was applying the romaine and changing it q1h but this is not usually the recommended treatment for drying up milk production.  During this past week, she would pump 3 times a day but her last attempt 4 days ago did not yield any milk from even the left breast.  LC recommends using the green cabbage and washing it thoroughly prior to applying to her breasts, change q1-2 h and may also follow her OB recommendation ([facility]) to apply ice packs for 15-20 minutes at 1-2 hour intervals for relief of breast pain and engorgement.  LC stressed importance of mom drinking plenty of fluids.  LC offered the option of pumping (after requesting permission from Physician, pending decision regarding the possible abscess on (R) breast) but mom states that she prefers drying up her milk and not pumping, as she is afraid of recurrence of mastitis/abscess.  FOB at bedside and [facility] informed Physician of mom's decision.  Baby was fussy but had taken 4 oz of formula an hour [facility], so latching baby to breast was not an option.  Once baby placed in mom's arms  and given pacifier, she fell asleep.  LC provided [facility] Resource brochure in Spanish and encouraged mom to call for questions or if desires an appointment.  Next possible appointment is Friday, April 24th at 0900 but parents state they prefer to wait and call, if needed.  Maternal Data  possible abscess on (R) breast with inflamed/swollen area of 10 cm x 8 cm (measured by Physician); location if upper quadrants of breast; scant milk expressible from soft (L) breast  Feeding  refusing breast >1 month  LATCH Score/Interventions         N/A - pumping offered but mom declined at this time             Lactation Tools Discussed/Used   DEBP Cabbage leaves and/or ice packs to relieve breast pain and reduce engorgement/milk production  Consult Status   LC to be notified by parents as needed   Zara ChessJoanne P Layth Cerezo 09/16/2013, 9:31 PM

## 2013-09-17 NOTE — MAU Provider Note (Signed)
Attestation of Attending Supervision of Advanced Practitioner (PA/CNM/NP): Evaluation and management procedures were performed by the Advanced Practitioner under my supervision and collaboration.  I have reviewed the Advanced Practitioner's note and chart, and I agree with the management and plan.  Halsey Hammen S Kandee Escalante, MD [facility] Attending 09/17/2013 7:27 AM   

## 2013-09-20 ENCOUNTER — Encounter (INDEPENDENT_AMBULATORY_CARE_PROVIDER_SITE_OTHER): Payer: Self-pay

## 2013-09-21 ENCOUNTER — Encounter (INDEPENDENT_AMBULATORY_CARE_PROVIDER_SITE_OTHER): Payer: Self-pay

## 2013-09-21 ENCOUNTER — Ambulatory Visit (INDEPENDENT_AMBULATORY_CARE_PROVIDER_SITE_OTHER): Payer: BC Managed Care – PPO

## 2013-09-21 NOTE — Progress Notes (Unsigned)
Patient arrived for nurse only p/o right breast abscess dressing change ; Patient spanish  speaking ,Ruth interrupter  Patient afebrile , cleansed area with NS 0.9% NS, applied 0.9% Ns packing to 1.5cm x2.0cm rt breast , applied 4x4 dressing , secured with medipore tape, patient tolerated well , Patient teaching to husband with verbal return with understanding. P/O appoint 10-03-13 3:20 with DR. Cornett . Advised patient to call if odor,serous drainage , temp 100.3 or greater. Patient/husband verblized understanding

## 2013-09-30 ENCOUNTER — Encounter (INDEPENDENT_AMBULATORY_CARE_PROVIDER_SITE_OTHER): Payer: BC Managed Care – PPO | Admitting: Surgery

## 2013-10-03 ENCOUNTER — Encounter (INDEPENDENT_AMBULATORY_CARE_PROVIDER_SITE_OTHER): Payer: Self-pay | Admitting: Surgery

## 2013-10-03 ENCOUNTER — Ambulatory Visit (INDEPENDENT_AMBULATORY_CARE_PROVIDER_SITE_OTHER): Payer: BC Managed Care – PPO | Admitting: Surgery

## 2013-10-03 VITALS — BP 126/70 | HR 74 | Temp 98.3°F | Resp 14 | Wt 120.0 lb

## 2013-10-03 DIAGNOSIS — O9122 Nonpurulent mastitis associated with the puerperium: Secondary | ICD-10-CM | POA: Insufficient documentation

## 2013-10-03 NOTE — Patient Instructions (Signed)
Mastitis  (Mastitis ) La mastitis es una inflamacin en el tejido mamario. Generalmente ocurre en las mujeres que amamantan, pero tambin puede afectar a otras mujeres y, en algunos casos, a los hombres. CAUSAS  Generalmente la causa de la mastitis es una infeccin bacteriana. Las bacterias ingresan al tejido mamario travs de cortes o grietas en la piel. Generalmente esto ocurre al amamantar, debido a las grietas o irritacin de la piel. En algunos casos puede ocurrir an cuando no haya grietas en la piel. Tambin se relaciona con la obstruccin de los conductos por los que sale la leche (lactferos). Un piercing en los pezones puede ocasionar una mastitis. Adems, algunas formas de cncer de mama pueden causar mastitis. SIGNOS Y SNTOMAS   Hinchazn, enrojecimiento, sensibilidad y dolor en la zona de la mama.  Hinchazn de los ganglios que se encuentran debajo del brazo, en el mismo lado.  Fiebre. Si se permite que la infeccin progrese, podr formarse una acumulacin de pus (absceso). DIAGNSTICO  El mdico podr hacer el diagnstico de mastitis en base a sus sntomas y el examen fsico. Le indicarn estudios para confirmar el diagnstico. Estos pueden ser:   Extraccin del pus de la mama, aplicando presin en la zona. El pus se examinar en el laboratorio para determinar de qu bacteria se trata. Si hay un absceso, podrn retirarle el lquido con una aguja. Con el lquido se confirmar el diagnstico y se determinar la bacteria que causa el problema. En la mayora de los casos no se observa pus.  Le solicitarn anlisis de sangre para determinar si su organismo est luchando contra una infeccin bacteriana.  Una mamografa o una ecografa podrn descartar otros problemas o enfermedades. TRATAMIENTO  Antibiticos para combatir la infeccin bacteriana. El profesional determinar qu bacteria es la que est causando la infeccin y seleccionar el tipo de antibitico ms adecuado. Podr  cambiarlo segn el resultado del cultivo o si la respuesta al antibitico no es la adecuada. Los antibiticos se administran por va oral. Tambin le recetar medicamentos para el dolor. La mastitis que se produce debido al amamantamiento podr mejorar sin tratamiento, por lo tanto el mdico podr indicarle que espere 24 horas despus de verla por primera vez para decidir si necesita recetarle un medicamento. INSTRUCCIONES PARA EL CUIDADO EN EL HOGAR   Slo tome medicamentos de venta libre o recetados para calmar el dolor, el malestar o bajar la fiebre, segn las indicaciones de su mdico.  Si su mdico le receta antibiticos, tmelos tal como le indic. Asegrese de que finaliza la prescripcin completa aunque se sienta mejor.  No use un sostn demasiado ajustado o con aro. Use un sostn blando, de soporte.  Aumente la ingestin de lquidos, especialmente si tiene fiebre.  Las mujeres que amamantan deben seguir las siguientes indicaciones:  Amamante hasta vaciar la mama. El profesional le informar si su leche es segura para el beb o debe descartarla. Podrn indicarle que deje de amamantar hasta que el mdico considere que es seguro para su beb. Use un sacaleche si le aconsejan dejar de amamantar.  Mantenga los pezones secos y limpios.  Vace la primera mama completamente antes de amamantar con la segunda. Si el beb no vaca la mama por algn motivo, utilice un sacaleche.  Si debe regresar a su empleo, use un sacaleche en el horario de trabajo para mantener los horarios.  Evite que las mamas se llenen mucho de leche (congestin) SOLICITE ATENCIN MDICA SI:   Tiene una secrecin similar a pus   por la mama.  Los sntomas no mejoran con el tratamiento indicado por su mdico dentro de los 2 das. SOLICITE ATENCIN MDICA DE INMEDIATO SI:   El dolor y la hinchazn empeoran.  Aumenta el dolor y no puede controlarlo con la medicacin.  Observa una lnea roja que se extiende desde la  mama hasta la axila.  Tiene fiebre o sntomas persistentes durante ms de 2 - 3 das.  Tiene fiebre y los sntomas empeoran repentinamente. Document Released: 02/26/2005 Document Revised: 03/09/2013 ExitCare Patient Information 2014 ExitCare, LLC.  

## 2013-10-03 NOTE — Progress Notes (Signed)
Subjective:     Patient ID: Sherry Houston, female   DOB: 05/24/1986, 28 y.o.   MRN: [id number]  HPI Pt seen with translator. [facility]. Abscess drained in ED. Pt here for follow up. Doing ok. Some milk drainage from incision.  Done with ABX  Review of Systems  Constitutional: Negative.   HENT: Negative.   Respiratory: Negative.        Objective:   Physical Exam  Constitutional: She appears well-developed and well-nourished.  Pulmonary/Chest:    Musculoskeletal: Normal range of motion.  Skin: Skin is warm.  Psychiatric: She has a normal mood and affect. Her behavior is normal. Judgment and thought content normal.       Assessment:     RIGHT lactational mastitis and abscess improving    Plan:     Return if wound does not heal in 4 weeks.  Pt is no longer breast feeding.

## 2014-03-07 ENCOUNTER — Ambulatory Visit: Payer: BC Managed Care – PPO | Admitting: Interventional Cardiology

## 2014-03-13 ENCOUNTER — Encounter: Payer: Self-pay | Admitting: Interventional Cardiology

## 2014-04-03 ENCOUNTER — Encounter (INDEPENDENT_AMBULATORY_CARE_PROVIDER_SITE_OTHER): Payer: Self-pay | Admitting: Surgery

## 2014-04-20 ENCOUNTER — Encounter: Payer: Self-pay | Admitting: Interventional Cardiology

## 2014-04-23 ENCOUNTER — Inpatient Hospital Stay (HOSPITAL_COMMUNITY)
Admission: AD | Admit: 2014-04-23 | Discharge: 2014-04-24 | Disposition: A | Discharge status: Home / Self Care | Payer: BC Managed Care – PPO | Source: Ambulatory Visit | Attending: Obstetrics & Gynecology | Admitting: Obstetrics & Gynecology

## 2014-04-23 ENCOUNTER — Encounter (HOSPITAL_COMMUNITY): Payer: Self-pay | Admitting: *Deleted

## 2014-04-23 DIAGNOSIS — R519 Headache, unspecified: Secondary | ICD-10-CM

## 2014-04-23 DIAGNOSIS — R110 Nausea: Secondary | ICD-10-CM | POA: Diagnosis not present

## 2014-04-23 DIAGNOSIS — R51 Headache: Secondary | ICD-10-CM | POA: Insufficient documentation

## 2014-04-23 DIAGNOSIS — I4510 Unspecified right bundle-branch block: Secondary | ICD-10-CM | POA: Insufficient documentation

## 2014-04-23 DIAGNOSIS — I450 Right fascicular block: Secondary | ICD-10-CM | POA: Diagnosis not present

## 2014-04-23 DIAGNOSIS — R011 Cardiac murmur, unspecified: Secondary | ICD-10-CM | POA: Diagnosis not present

## 2014-04-23 HISTORY — DX: Thromboembolism in pregnancy, second trimester: O88.212

## 2014-04-23 LAB — POCT PREGNANCY, URINE: Preg Test, Ur: NEGATIVE

## 2014-04-23 LAB — URINE MICROSCOPIC-ADD ON

## 2014-04-23 LAB — URINALYSIS, ROUTINE W REFLEX MICROSCOPIC
Bilirubin Urine: NEGATIVE
Glucose, UA: NEGATIVE mg/dL
KETONES UR: NEGATIVE mg/dL
NITRITE: NEGATIVE
Protein, ur: NEGATIVE mg/dL
Specific Gravity, Urine: 1.01 (ref 1.005–1.030)
Urobilinogen, UA: 0.2 mg/dL (ref 0.0–1.0)
pH: 6 (ref 5.0–8.0)

## 2014-04-23 MED ORDER — BUTALBITAL-APAP-CAFFEINE 50-325-40 MG PO TABS
2.0000 | ORAL_TABLET | Freq: Once | ORAL | Status: AC
  Administered 2014-04-24: 2 via ORAL
  Filled 2014-04-23: qty 2

## 2014-04-23 MED ORDER — ONDANSETRON HCL 4 MG PO TABS
8.0000 mg | ORAL_TABLET | Freq: Once | ORAL | Status: AC
  Administered 2014-04-24: 8 mg via ORAL
  Filled 2014-04-23: qty 2

## 2014-04-23 NOTE — Progress Notes (Signed)
Assisted RN with interpretation of triage. °Spanish Interpreter - Benita Sanchez °

## 2014-04-23 NOTE — Progress Notes (Signed)
Assisted RN with interpretation of few secondary questions Spanish Interpreter - Sherry GlassmanBenita Houston

## 2014-04-23 NOTE — Progress Notes (Signed)
Assisted RN with interpretation of initial evaluation of patient. Spanish Interpreter -Joselyn GlassmanBenita Houston

## 2014-04-23 NOTE — MAU Provider Note (Signed)
Chief Complaint: Arm Pain; Nausea; and Shoulder Pain   First Provider Initiated Contact with Patient 04/24/14 0032      SUBJECTIVE HPI: Sherry Houston is a 28 y.o. G1P1001 non-pregnant female when present to MAU w/ multiple complaints: 1. 1 week Hx of arms hurting, L>R. Pain 3-4/10 upon waking up. ~2/10 otherwise--constant.  2. Nausea since last night. No vomiting. 3. HA behind eyes today. 3/10, constant.  4. 1 brief episode of rapid heart rate in the past week. No precipitating events. Resolved spontaneously. No SOB or chest pain w/ episode.  5. 1 brief episode of central chest pain 3/10.  No precipitating events. Resolved spontaneously. 6. Some fingertips and toes intermittently feeling cold.  Has not tried any medications or comfort measures for Sx. Has not seen any provider since Sx started. Does not have PCP. [facility] had a heart murmur and triglycerides were high. No medicine prescribed. Referred to cardiologist, but appointment not available until January 11. Patient very concerned because she is traveling out of the country in December with possible undiagnosed heart problems.  History obtained via bedside interpreter.  Past Medical History  Diagnosis Date  . Medical history non-contributory   . Obstetrical blood clot embolism in second trimester    OB History  Gravida Para Term Preterm AB SAB TAB Ectopic Multiple Living  1 1 1       1     # Outcome Date GA Lbr Len/2nd Weight Sex Delivery Anes PTL Lv  1 Term 07/01/13    F Vag-Spont Local  Y     Comments: System Generated. Please review and update pregnancy details.     Past Surgical History  Procedure Laterality Date  . No past surgeries    . Engorgement      made incision in breast to drain @ 57mo Post poartum   History   Social History  . Marital Status: Married    Spouse Name: N/A    Number of Children: N/A  . Years of Education: N/A   Occupational History  . Not on  file.   Social History Main Topics  . Smoking status: Never Smoker   . Smokeless tobacco: Never Used  . Alcohol Use: No  . Drug Use: No  . Sexual Activity: Yes    Birth Control/ Protection: Condom     Comment: last sex three days   Other Topics Concern  . Not on file   Social History Narrative   ** Merged History Encounter **       No current facility-administered medications on file prior to encounter.   Current Outpatient Prescriptions on File Prior to Encounter  Medication Sig Dispense Refill  . Prenatal Vit-Fe Fumarate-FA (PRENATAL MULTIVITAMIN) TABS Take 1 tablet by mouth daily at 12 noon.    . cephALEXin (KEFLEX) 750 MG capsule     . ibuprofen (ADVIL,MOTRIN) 800 MG tablet Take 800 mg by mouth every 8 (eight) hours as needed.    Marland Kitchen. oxyCODONE-acetaminophen (PERCOCET/ROXICET) 5-325 MG per tablet Take 1-2 tablets by mouth every 4 (four) hours as needed. 30 tablet 0   No Known Allergies  ROS: Pertinent positive items in HPI. Negative for fever, chills, shortness of breath, current chest pain, vomiting, diarrhea, constipation, dizziness, weakness, difficulties with speech or gait, upper respiratory complaints, urinary complaints or abdominal pain.  OBJECTIVE Blood pressure 123/81, pulse 98, temperature 97.5 F (36.4 C), temperature source Oral, resp. rate 16, height 5' 2.5" (1.588  m), weight 56.246 kg (124 lb), last menstrual period 04/02/2014, SpO2 100 %, unknown if currently breastfeeding. GENERAL: Well-developed, well-nourished female in no acute distress. No cyanosis. HEENT: Normocephalic HEART: normal rate and rhythm. 2/6 systolic murmur. No rubs or gallops. RESP: normal effort. Clear to auscultation bilaterally ABDOMEN: Soft, non-tender EXTREMITIES: Nontender, no edema. Normal strength and range of motion of arms bilaterally. Arms shoulders and neck nontender. Normal range of motion bilaterally. NEURO: Alert and oriented  LAB RESULTS Results for orders placed or  performed during the hospital encounter of 04/23/14 (from the past 24 hour(s))  Urinalysis, Routine w reflex microscopic     Status: Abnormal   Collection Time: 04/23/14  8:56 PM  Result Value Ref Range   Color, Urine YELLOW YELLOW   APPearance CLEAR CLEAR   Specific Gravity, Urine 1.010 1.005 - 1.030   pH 6.0 5.0 - 8.0   Glucose, UA NEGATIVE NEGATIVE mg/dL   Hgb urine dipstick SMALL (A) NEGATIVE   Bilirubin Urine NEGATIVE NEGATIVE   Ketones, ur NEGATIVE NEGATIVE mg/dL   Protein, ur NEGATIVE NEGATIVE mg/dL   Urobilinogen, UA 0.2 0.0 - 1.0 mg/dL   Nitrite NEGATIVE NEGATIVE   Leukocytes, UA SMALL (A) NEGATIVE  Urine microscopic-add on     Status: Abnormal   Collection Time: 04/23/14  8:56 PM  Result Value Ref Range   Squamous Epithelial / LPF FEW (A) RARE   WBC, UA 3-6 <3 WBC/hpf   RBC / HPF 0-2 <3 RBC/hpf   Bacteria, UA RARE RARE  Pregnancy, urine POC     Status: None   Collection Time: 04/23/14  9:12 PM  Result Value Ref Range   Preg Test, Ur NEGATIVE NEGATIVE    IMAGING EKG shows incomplete right bundle branch block, normal sinus rhythm. Findings confirmed by Glori Luis, cardiologist. He describes findings as a normal variant. Not an emergent finding. Does not need transfer to [facility] for further evaluation.  MAU COURSE Fioricet and Zofran given. Headache and nausea resolved.  Lengthy conversation with patient about need for primary care provider and non-emergent cardiology follow-up for workup of heart murmur. Explained that [facility] is not the ideal location for full evaluation and management of her various concerns and symptoms, but after consultation with cardiologist and Dr. Erin Fulling no emergent condition is apparent.. Explained that it can often take 4-6 weeks to get appointment with a specialist.  ASSESSMENT 1. Incomplete right bundle branch block   2. Heart murmur on physical examination   3. Acute nonintractable headache, unspecified headache type    4. Nausea    PLAN Discharge home in stable condition. Cardiac and stroke red flags reviewed with patient with interpreter at bedside.   Follow-up Information    Follow up with [facility]   Contact information:   [address] [place] [address]       Follow up with [facility]   Contact information:   [address] [place] [address] [phone]      Follow up with [facility].   Why:  As needed in emergencies   Contact information:   [address] [place] [address]       Follow up with [facility].   Specialty:  Family Medicine   Contact information:   [address] [id number] [place] [address] [phone]        Medication List    STOP taking  these medications        cephALEXin 750 MG capsule  Commonly known as:  KEFLEX     oxyCODONE-acetaminophen 5-325 MG per tablet  Commonly known as:  PERCOCET/ROXICET     prenatal multivitamin Tabs tablet      TAKE these medications        butalbital-acetaminophen-caffeine 50-325-40 MG per tablet  Commonly known as:  FIORICET  Take 1-2 tablets by mouth every 6 (six) hours as needed for headache.     ibuprofen 800 MG tablet  Commonly known as:  ADVIL,MOTRIN  Take 800 mg by mouth every 8 (eight) hours as needed.     promethazine 25 MG tablet  Commonly known as:  PHENERGAN  Take 1 tablet (25 mg total) by mouth every 6 (six) hours as needed for nausea or vomiting.         [place]ia Gizella [place], CNM 04/23/2014  11:34 PM

## 2014-04-23 NOTE — Progress Notes (Signed)
Assisted MAU interpretation of registation with patient information. Spanish Interpreter -Sherry GlassmanBenita Houston

## 2014-04-23 NOTE — MAU Note (Signed)
Pt c/o left arm pain for the past week and sometimes her right arm hurts. She rates the pain a "3 or 4".  Has not seen any provider since the pain started nor has she taken any pain meds.  She also states that nausea started last night and continues today along with a headache behind her eyes.

## 2014-04-24 DIAGNOSIS — I450 Right fascicular block: Secondary | ICD-10-CM

## 2014-04-24 MED ORDER — PROMETHAZINE HCL 25 MG PO TABS: 25.0000 mg | ORAL_TABLET | Freq: Four times a day (QID) | ORAL | Status: DC | PRN

## 2014-04-24 MED ORDER — BUTALBITAL-APAP-CAFFEINE 50-325-40 MG PO TABS: 1.0000 | ORAL_TABLET | Freq: Four times a day (QID) | ORAL | Status: DC | PRN

## 2014-04-24 NOTE — Discharge Instructions (Signed)
Soplo cardaco (Heart Murmur) Un soplo cardaco es un sonido adicional que su mdico oye cuando escucha su corazn con un dispositivo denominado estetoscopio. El sonido proviene de una turbulencia provocada cuando la sangre circula a travs del corazn y el sonido puede ser como un zumbido o un silbido cuando el corazn late. Existen dos tipos de soplos cardacos:  Soplo cardaco inocente. La mayora de las personas con este tipo de soplo no tienen un problema cardaco. Muchos nios tienen soplos cardacos inocentes. Su mdico puede sugerir [employer] bsicos para saber si el soplo es un soplo inocente. Si se descubre un soplo cardaco inocente, no hay necesidad de [employer], Tax inspector, restringir las actividades ni suspender la prctica de deportes.  Soplo cardaco anormal. Este tipo de soplo cardaco puede aparecer en nios y adultos. En los nios, por lo general los soplos cardacos anormales son causados por defectos cardacos que estn presentes desde el nacimiento (congnitos). En los adultos, los soplos anormales normalmente provienen de problemas en las vlvulas cardacas causados por una enfermedad, infeccin o el envejecimiento. CAUSAS  Todos los soplos cardacos son consecuencia de un problema con las vlvulas cardacas. Normalmente, estas vlvulas se abren para dejar que la sangre circule a travs o fuera del corazn y Engineer, mining se cierran para evitar que la sangre regrese. Si las vlvulas no funcionan correctamente, puede experimentar los siguientes sntomas:  Regurgitacin: cuando la sangre se filtra a travs de la vlvula en la direccin incorrecta.  Prolapso de la vlvula mitral: cuando la vlvula mitral del corazn tiene una aleta suelta y no se cierra hermticamente.  Estenosis: cuando la vlvula no se abre lo suficiente y bloquea el flujo sanguneo. Blake Divine SNTOMAS  Los soplos inocentes no provocan sntomas, y [place] personas con soplos anormales pueden  tener sntomas o no. Si hay sntomas, pueden ser:  Harrel Lemon de aire.  Color azul en la piel, especialmente en las puntas de los dedos.  Dolor en el pecho.  Palpitaciones, sentir un aleteo o latido cardaco irregular.  Desmayos.  Tos persistente.  Cansarse con mucha ms rapidez de lo esperado. DIAGNSTICO  Un soplo cardaco se puede escuchar durante un examen mdico deportivo o durante otro tipo de examen. Cuando se escucha un soplo, esto puede sugerir un posible problema. Cuando esto ocurre, es posible que el mdico le recomiende ver a Journalist, newspaper (cardilogo). Tambin le pueden pedir que se realice uno o ms estudios cardacos. En [employer], los estudios pueden variar segn lo que escuch su mdico. Estos son algunos de los estudios indicados para un soplo cardaco:  Materials engineer.  Ecocardiograma.  Resonancia magntica. Para nios y adultos que tienen un soplo cardaco anormal y desean Microbiologist, es importante completar los estudios, Chiropractor los [place] con su mdico y seguir sus TEFL teacher. Si hay una enfermedad cardaca, es posible que no sea seguro practicar un deporte. TRATAMIENTO  Los soplos inocentes no requieren tratamiento ni restriccin de actividades. Si un soplo anormal representa un problema en el corazn, el tratamiento depender de la naturaleza exacta del problema. En [employer], es posible que sean necesarios medicamentos o ciruga para tratar el problema. INSTRUCCIONES PARA EL CUIDADO EN EL HOGAR Si desea practicar un deporte u otros tipos de [place] fsica intensa, es importante analizar esta situacin primero con su mdico. Si el soplo representa un problema en el corazn y decide practicar un deporte, existe una pequea probabilidad de que suceda un problema grave (incluida muerte sbita).  SOLICITE ATENCIN MDICA SI:  Siente que sus sntomas empeoran lentamente.  Desarrolla sntomas nuevos que le preocupan.  Siente que  los medicamentos recetados le provocan efectos secundarios. SOLICITE ATENCIN MDICA DE INMEDIATO SI:   Siente dolor en el pecho.  Le falta el aire.  Observa que su corazn late de forma irregular, con una frecuencia preocupante  Sufre episodios de Baxter Internationaldesmayo.  Los sntomas empeoran repentinamente. Document Released: 05/19/2005 Document Revised: 10/03/2013 [facility] Patient Information 2015 [place], [place]. This information is not intended to replace advice given to you by your health care provider. Make sure you discuss any questions you have with your health care provider.  Nausea and Vomiting Nausea is a sick feeling that often comes before throwing up (vomiting). Vomiting is a reflex where stomach contents come out of your mouth. Vomiting can cause severe loss of body fluids (dehydration). Children and elderly adults can become dehydrated quickly, especially if they also have diarrhea. Nausea and vomiting are symptoms of a condition or disease. It is important to find the cause of your symptoms. CAUSES   Direct irritation of the stomach lining. This irritation can result from increased acid production (gastroesophageal reflux disease), infection, food poisoning, taking certain medicines (such as nonsteroidal anti-inflammatory drugs), alcohol use, or tobacco use.  Signals from the brain.These signals could be caused by a headache, heat exposure, an inner ear disturbance, increased pressure in the brain from injury, infection, a tumor, or a concussion, pain, emotional stimulus, or metabolic problems.  An obstruction in the gastrointestinal tract (bowel obstruction).  Illnesses such as diabetes, hepatitis, gallbladder problems, appendicitis, kidney problems, cancer, sepsis, atypical symptoms of a heart attack, or eating disorders.  Medical treatments such as chemotherapy and radiation.  Receiving medicine that makes you sleep (general anesthetic) during surgery. DIAGNOSIS Your caregiver  may ask for tests to be done if the problems do not improve after a few days. Tests may also be done if symptoms are severe or if the reason for the nausea and vomiting is not clear. Tests may include:  Urine tests.  Blood tests.  Stool tests.  Cultures (to look for evidence of infection).  X-rays or other imaging studies. Test results can help your caregiver make decisions about treatment or the need for additional tests. TREATMENT You need to stay well hydrated. Drink frequently but in small amounts.You may wish to drink water, sports drinks, clear broth, or eat frozen ice pops or gelatin dessert to help stay hydrated.When you eat, eating slowly may help prevent nausea.There are also some antinausea medicines that may help prevent nausea. HOME CARE INSTRUCTIONS   Take all medicine as directed by your caregiver.  If you do not have an appetite, do not force yourself to eat. However, you must continue to drink fluids.  If you have an appetite, eat a normal diet unless your caregiver tells you differently.  Eat a variety of complex carbohydrates (rice, wheat, potatoes, bread), lean meats, yogurt, fruits, and vegetables.  Avoid high-fat foods because they are more difficult to digest.  Drink enough water and fluids to keep your urine clear or pale yellow.  If you are dehydrated, ask your caregiver for specific rehydration instructions. Signs of dehydration may include:  Severe thirst.  Dry lips and mouth.  Dizziness.  Dark urine.  Decreasing urine frequency and amount.  Confusion.  Rapid breathing or pulse. SEEK IMMEDIATE MEDICAL CARE IF:   You have blood or brown flecks (like coffee grounds) in your vomit.  You have black or bloody stools.  You have  a severe headache or stiff neck.  You are confused.  You have severe abdominal pain.  You have chest pain or trouble breathing.  You do not urinate at least once every 8 hours.  You develop cold or clammy  skin.  You continue to vomit for longer than 24 to 48 hours.  You have a fever. MAKE SURE YOU:   Understand these instructions.  Will watch your condition.  Will get help right away if you are not doing well or get worse. Document Released: 05/19/2005 Document Revised: 08/11/2011 Document Reviewed: 10/16/2010 [facility] Patient Information 2015 [place], [place]. This information is not intended to replace advice given to you by your health care provider. Make sure you discuss any questions you have with your health care provider.

## 2014-04-25 LAB — URINE CULTURE: SPECIAL REQUESTS: NORMAL

## 2014-06-12 ENCOUNTER — Encounter: Payer: Self-pay | Admitting: Interventional Cardiology

## 2014-06-12 ENCOUNTER — Ambulatory Visit (HOSPITAL_COMMUNITY): Payer: 59 | Attending: Cardiology | Admitting: Radiology

## 2014-06-12 ENCOUNTER — Ambulatory Visit (INDEPENDENT_AMBULATORY_CARE_PROVIDER_SITE_OTHER): Payer: 59 | Admitting: Interventional Cardiology

## 2014-06-12 VITALS — BP 110/60 | HR 77 | Ht 62.0 in | Wt 116.0 lb

## 2014-06-12 DIAGNOSIS — I340 Nonrheumatic mitral (valve) insufficiency: Secondary | ICD-10-CM | POA: Insufficient documentation

## 2014-06-12 DIAGNOSIS — R011 Cardiac murmur, unspecified: Secondary | ICD-10-CM | POA: Diagnosis not present

## 2014-06-12 DIAGNOSIS — M79605 Pain in left leg: Secondary | ICD-10-CM

## 2014-06-12 DIAGNOSIS — M79602 Pain in left arm: Secondary | ICD-10-CM

## 2014-06-12 NOTE — Progress Notes (Signed)
Echocardiogram performed.  

## 2014-06-12 NOTE — Patient Instructions (Signed)
Your physician recommends that you continue on your current medications as directed. Please refer to the Current Medication list given to you today.  Your physician has requested that you have an echocardiogram. Echocardiography is a painless test that uses sound waves to create images of your heart. It provides your doctor with information about the size and shape of your heart and how well your heart's chambers and valves are working. This procedure takes approximately one hour. There are no restrictions for this procedure.  Your physician recommends that you schedule a follow-up appointment as needed with Dr. Varanasi.   

## 2014-06-12 NOTE — Progress Notes (Signed)
Patient ID: Sherry Houston, female   DOB: [birth year], 29 y.o.   MRN: [id number]     Patient ID: Sherry Houston MRN: [id number] DOB/AGE: [birth year] 29 y.o.   Referring Physician; Doristine Counter   Reason for Consultation : Heart murmur  HPI: 29 year old woman who was noted to have a heart murmur on routine physical exam. Within the past year, she has had a baby and was also found to have a DVT. She was treated with Lovenox. She denies any chest discomfort or shortness of breath. She has had some left arm pain. No lightheadedness or syncope. No leg swelling. No shortness of breath while lying flat. No wheezing noted.  She does note that she had a sister who had some type of heart problem as a child and died many years ago.   Current Outpatient Prescriptions  Medication Sig Dispense Refill  . butalbital-acetaminophen-caffeine (FIORICET) 50-325-40 MG per tablet Take 1-2 tablets by mouth every 6 (six) hours as needed for headache. (Patient not taking: Reported on 06/12/2014) 20 tablet 1  . ibuprofen (ADVIL,MOTRIN) 800 MG tablet Take 800 mg by mouth every 8 (eight) hours as needed.    . promethazine (PHENERGAN) 25 MG tablet Take 1 tablet (25 mg total) by mouth every 6 (six) hours as needed for nausea or vomiting. (Patient not taking: Reported on 06/12/2014) 30 tablet 1   No current facility-administered medications for this visit.   Past Medical History  Diagnosis Date  . Medical history non-contributory   . Obstetrical blood clot embolism in second trimester     No family history on file.  History   Social History  . Marital Status: Married    Spouse Name: N/A    Number of Children: N/A  . Years of Education: N/A   Occupational History  . Not on file.   Social History Main Topics  . Smoking status: Never Smoker   . Smokeless tobacco: Never Used  . Alcohol Use: No  . Drug Use: No  . Sexual Activity: Yes    Birth Control/ Protection: Condom     Comment: last sex  three days   Other Topics Concern  . Not on file   Social History Narrative   ** Merged History Encounter **        Past Surgical History  Procedure Laterality Date  . No past surgeries    . Engorgement      made incision in breast to drain @ 4mo Post poartum      (Not in a hospital admission)  Review of systems complete and found to be negative unless listed above .  No nausea, vomiting.  No fever chills, No focal weakness,  No palpitations.  Physical Exam: Filed Vitals:   06/12/14 1043  BP: 110/60  Pulse: 77    Weight: 116 lb (52.617 kg)  Physical exam:  In no apparent distress [place]/AT EOMI No JVD, No carotid bruit RRR S1S2 ; 2/6 systolic murmur No wheezing Soft. NT, nondistended No edema. No focal motor or sensory deficits Normal affect   Labs:   Lab Results  Component Value Date   WBC 12.8* 09/16/2013   HGB 12.6 09/16/2013   HCT 37.8 09/16/2013   MCV 82.0 09/16/2013   PLT 333 09/16/2013   No results for input(s): NA, K, CL, CO2, BUN, CREATININE, CALCIUM, PROT, BILITOT, ALKPHOS, ALT, AST, GLUCOSE in the last 168 hours.  Invalid input(s): LABALBU No results found for: CKTOTAL, CKMB, CKMBINDEX, TROPONINI No results found for: CHOL  No results found for: HDL No results found for: LDLCALC No results found for: TRIG No results found for: CHOLHDL No results found for: LDLDIRECT     EKG: Normal sinus rhythm, no significant ST-T wave changes  ASSESSMENT AND PLAN:  1) heart murmur: Noted on exam. She does have some left arm pain which she is concerned about. She is also concerned given her family history. Will check ultrasound to evaluate for any structural heart disease. I doubt she will have any severe valvular lesion at this point.  2) arm pain: Likely musculoskeletal. Not related to exertion. She does have an 33109-month-old at home that she has to carry frequently.   Signed:   Fredric MareJay S. Larkyn Greenberger, MD, [facility] HospitalFACC 06/12/2014, 11:03 AM

## 2014-06-16 ENCOUNTER — Telehealth: Payer: Self-pay | Admitting: Interventional Cardiology

## 2014-06-16 ENCOUNTER — Encounter: Payer: Self-pay | Admitting: *Deleted

## 2014-06-16 NOTE — Telephone Encounter (Signed)
Error

## 2014-08-18 ENCOUNTER — Ambulatory Visit (INDEPENDENT_AMBULATORY_CARE_PROVIDER_SITE_OTHER): Payer: 59 | Admitting: Family Medicine

## 2014-08-18 VITALS — BP 110/70 | HR 76 | Temp 98.0°F | Resp 16 | Ht 62.5 in | Wt 121.0 lb

## 2014-08-18 DIAGNOSIS — N898 Other specified noninflammatory disorders of vagina: Secondary | ICD-10-CM

## 2014-08-18 DIAGNOSIS — Z124 Encounter for screening for malignant neoplasm of cervix: Secondary | ICD-10-CM | POA: Diagnosis not present

## 2014-08-18 DIAGNOSIS — Z01419 Encounter for gynecological examination (general) (routine) without abnormal findings: Secondary | ICD-10-CM

## 2014-08-18 LAB — POCT WET PREP WITH KOH
KOH PREP POC: NEGATIVE
Trichomonas, UA: NEGATIVE
Yeast Wet Prep HPF POC: NEGATIVE

## 2014-08-18 LAB — POCT URINE PREGNANCY: PREG TEST UR: NEGATIVE

## 2014-08-18 MED ORDER — METRONIDAZOLE 500 MG PO TABS: 500.0000 mg | ORAL_TABLET | Freq: Two times a day (BID) | ORAL | Status: DC

## 2014-08-18 NOTE — Progress Notes (Signed)
[facility] [address], [place] [address] [phone]  Date:  08/18/2014   Name:  Sherry Houston   DOB:  11/30/1985   MRN:  [id number]  PCP:  No PCP Per Patient    Chief Complaint: Gynecologic Exam   History of Present Illness:  Sherry Houston is a [age] y.o. very pleasant female patient who presents with the following:  Here today to discuss a vaginal discharge that has been present for about one month.  It seemed to come just before her menses last month, got better, and then returned with her current menses.   No itching or pain.    Her last pap was a little over a year ago- her daughter was born in 2/15.   Her LMP started 3/14, but it did not seem to be a normal period- it was lighter than usual and is just now ending She does not think that she is PG again.  Her breasts do not hurt  She is not nursing any longer Patient Active Problem List   Diagnosis Date Noted  . Mastitis, obstetric, postpartum condition 10/03/2013  . DVT (deep vein thrombosis) in pregnancy 03/18/2013    Past Medical History  Diagnosis Date  . Medical history non-contributory   . Obstetrical blood clot embolism in second trimester     Past Surgical History  Procedure Laterality Date  . No past surgeries    . Engorgement      made incision in breast to drain @ 31mo Post poartum    History  Substance Use Topics  . Smoking status: Never Smoker   . Smokeless tobacco: Never Used  . Alcohol Use: No    Family History  Problem Relation Age of Onset  . Heart failure Sister     No Known Allergies  Medication list has been reviewed and updated.  Current Outpatient Prescriptions on File Prior to Visit  Medication Sig Dispense Refill  . butalbital-acetaminophen-caffeine (FIORICET) 50-325-40 MG per tablet Take 1-2 tablets by mouth every 6 (six) hours as needed for headache. (Patient not taking: Reported on 06/12/2014) 20 tablet 1  . ibuprofen (ADVIL,MOTRIN) 800 MG  tablet Take 800 mg by mouth every 8 (eight) hours as needed.    . promethazine (PHENERGAN) 25 MG tablet Take 1 tablet (25 mg total) by mouth every 6 (six) hours as needed for nausea or vomiting. (Patient not taking: Reported on 06/12/2014) 30 tablet 1   No current facility-administered medications on file prior to visit.    Review of Systems:  As per HPI- otherwise negative.   Physical Examination: Filed Vitals:   08/18/14 1650  BP: 110/70  Pulse: 76  Temp: 98 F (36.7 C)  Resp: 16   Filed Vitals:   08/18/14 1650  Height: 5' 2.5" (1.588 m)  Weight: 121 lb (54.885 kg)   Body mass index is 21.76 kg/(m^2). Ideal Body Weight: Weight in (lb) to have BMI = 25: 138.6  GEN: WDWN, NAD, Non-toxic, A & O x 3, looks well HEENT: Atraumatic, Normocephalic. Neck supple. No masses, No LAD. Ears and Nose: No external deformity. CV: RRR, No M/G/R. No JVD. No thrill. No extra heart sounds. PULM: CTA B, no wheezes, crackles, rhonchi. No retractions. No resp. distress. No accessory muscle use. ABD: S, NT, ND, +BS. No rebound. No HSM. Benign exam EXTR: No c/c/e NEURO Normal gait.  PSYCH: Normally interactive. Conversant. Not depressed or anxious appearing.  Calm demeanor.  Gu: normal exam, is  just ending her menses.  Normal external genitals, normal vagina and cervix, no CMT, no adnexal tenderness or masses   Results for orders placed or performed in visit on 08/18/14  POCT urine pregnancy  Result Value Ref Range   Preg Test, Ur Negative   POCT Wet Prep with KOH  Result Value Ref Range   Trichomonas, UA Negative    Clue Cells Wet Prep HPF POC 0-2    Epithelial Wet Prep HPF POC 2-6    Yeast Wet Prep HPF POC neg    Bacteria Wet Prep HPF POC trace    RBC Wet Prep HPF POC 0-1    WBC Wet Prep HPF POC 2-3    KOH Prep POC Negative     Assessment and Plan: Vaginal discharge - Plan: POCT urine pregnancy, POCT Wet Prep with KOH, Pap IG, CT/NG w/ reflex HPV when ASC-U, metroNIDAZOLE (FLAGYL)  500 MG tablet  Screening for cervical cancer - Plan: Pap IG, CT/NG w/ reflex HPV when ASC-U   Did pap today per her request.  Reassured that all appears normal, but she may have BV.  Will treat with flagyl, and will be in touch with the rest of her labs asap   Signed Abbe Amsterdam, MD

## 2014-08-18 NOTE — Patient Instructions (Signed)
[facility] [address] has your rx.  I will be in touch with your pap results

## 2014-08-21 ENCOUNTER — Encounter: Payer: Self-pay | Admitting: Family Medicine

## 2014-08-21 LAB — PAP IG, CT-NG, RFX HPV ASCU
Chlamydia Probe Amp: NEGATIVE
GC PROBE AMP: NEGATIVE

## 2014-09-02 ENCOUNTER — Ambulatory Visit (INDEPENDENT_AMBULATORY_CARE_PROVIDER_SITE_OTHER): Payer: 59 | Admitting: Family Medicine

## 2014-09-02 VITALS — BP 100/60 | HR 74 | Temp 98.1°F | Resp 16 | Ht 62.5 in | Wt 120.0 lb

## 2014-09-02 DIAGNOSIS — K140 Glossitis: Secondary | ICD-10-CM | POA: Insufficient documentation

## 2014-09-02 MED ORDER — PENICILLIN V POTASSIUM 500 MG PO TABS: 500.0000 mg | ORAL_TABLET | Freq: Three times a day (TID) | ORAL | Status: DC

## 2014-09-02 MED ORDER — MAGIC MOUTHWASH: 5.0000 mL | Freq: Three times a day (TID) | ORAL | Status: DC

## 2014-09-02 NOTE — Progress Notes (Signed)
Patient ID: Sherry Houston, female   DOB: 01/31/1986, 29 y.o.   MRN: [id number]   This chart was scribed for Elvina SidleKurt Lauenstein, MD by [facility] Surgery Center IncNadim Abu Hashem, medical scribe at [facility].The patient was seen in exam room 07 and the patient's care was started at 3:55 PM.  Patient ID: Sherry Hurtsther Solis Houston MRN: [id number], DOB: 02/21/1986, 29 y.o. Date of Encounter: 09/02/2014  Primary Physician: No PCP Per Patient  Chief Complaint:  Chief Complaint  Patient presents with   Sore on Tongue   HPI:  Sherry Hurtsther Solis Houston is a 29 y.o. female who [facility] complaining of sore on her tongue for several months. She feels like something is behind her tongue as well as her tongue is swollen.  Past Medical History  Diagnosis Date   Medical history non-contributory    Obstetrical blood clot embolism in second trimester    Home Meds: Prior to Admission medications   Not on File   Allergies: No Known Allergies  History   Social History   Marital Status: Married    Spouse Name: N/A   Number of Children: N/A   Years of Education: N/A   Occupational History   Not on file.   Social History Main Topics   Smoking status: Never Smoker    Smokeless tobacco: Never Used   Alcohol Use: No   Drug Use: No   Sexual Activity: Yes    Birth Tax adviserControl/ Protection: Condom     Comment: last sex three days   Other Topics Concern   Not on file   Social History Narrative   ** Merged History Encounter **        Review of Systems: Constitutional: negative for chills, fever, night sweats, weight changes, or fatigue  HEENT: negative for vision changes, hearing loss, congestion, rhinorrhea, ST, epistaxis, or sinus pressure Cardiovascular: negative for chest pain or palpitations Respiratory: negative for hemoptysis, wheezing, shortness of breath, or cough Abdominal: negative for abdominal pain, nausea, vomiting, diarrhea, or  constipation Dermatological: negative for rash Neurologic: negative for headache, dizziness, or syncope All other systems reviewed and are otherwise negative with the exception to those above and in the HPI.  Physical Exam: Blood pressure 100/60, pulse 74, temperature 98.1 F (36.7 C), temperature source Oral, resp. rate 16, height 5' 2.5" (1.588 m), weight 120 lb (54.432 kg), last menstrual period 08/05/2014, SpO2 99 %, unknown if currently breastfeeding., Body mass index is 21.59 kg/(m^2). General: Well developed, well nourished, in no acute distress. Head: Normocephalic, atraumatic, eyes without discharge, sclera non-icteric, nares are without discharge. Bilateral auditory canals clear, TM's are without perforation, pearly grey and translucent with reflective cone of light bilaterally. Oral cavity moist, posterior pharynx without exudate, erythema, peritonsillar abscess, or post nasal drip. Her tongue has multiple red papules with early fissuring on the tip of her tongue.  Neck: Supple. No thyromegaly. Full ROM. No lymphadenopathy. Lungs: Clear bilaterally to auscultation without wheezes, rales, or rhonchi. Breathing is unlabored. Heart: RRR with S1 S2. No murmurs, rubs, or gallops appreciated. Abdomen: Soft, non-tender, non-distended with normoactive bowel sounds. No hepatomegaly. No rebound/guarding. No obvious abdominal masses. Msk:  Strength and tone normal for age. Extremities/Skin: Warm and dry. No clubbing or cyanosis. No edema. No rashes or suspicious lesions. Neuro: Alert and oriented X 3. Moves all extremities spontaneously. Gait is normal. CNII-XII grossly in tact. Psych:  Responds to questions appropriately with a normal affect.   Labs:  ASSESSMENT AND  PLAN:  29 y.o. year old female with  This chart was scribed in my presence and reviewed by me personally.    ICD-9-CM ICD-10-CM   1. Glossitis 529.0 K14.0 penicillin v potassium (VEETID) 500 MG tablet     Alum & Mag  Hydroxide-Simeth (MAGIC MOUTHWASH) SOLN     Signed, Elvina Sidle, MD  Signed, Elvina Sidle, MD 09/02/2014 3:55 PM

## 2014-09-02 NOTE — Patient Instructions (Signed)
necesita comprar y tomar L-lysine.  Es un tipo de proteina que puede ayudar la surface de la lengua    Glositis (Glossitis) La glositis es una inflamacin de la lengua. Los Baker Hughes Incorporatedcambios en el aspecto de la lengua pueden deberse a un trastorno primario de este rgano. Esto significa que el problema se encuentra slo en la lengua. Tambin puede ser el sntoma de otros trastornos.  CAUSAS  Infecciones  Alergias mltiples  Consumo excesivo de alcohol  Lesiones mecnicas  Anemia  Consumo de tabaco y nicotina  Comidas muy condimentadas  Dficit de vitamina B  Lesiones producidas por sustancias qumicas o bebidas o alimentos calientes SNTOMAS Puede haber hinchazn y [employer] Inccambios en el color. Algunas veces la superficie de la lengua puede verse lisa. Este trastorno puede ser indoloro. Pero generalmente la lengua duele y est sensible. Puede estar de color rojo ardiente si el problema est ocasionado por un dficit de vitamina B. Si est plida puede tratarse de anemia. Anemia significa que no hay suficientes glbulos rojos en la sangre. Puede haber problemas para Product managermasticar, tragar y hablar. En algunos casos, la glositis puede dar como resultado una hinchazn grave que bloquea el paso del [place]. DIAGNSTICO El diagnstico de la glositis se realiza a travs del examen fsico y del interrogatorio. Podrn realizarle anlisis de [place]. TRATAMIENTO  El objetivo del tratamiento es reducir la inflamacin. Generalmente la hospitalizacin no es necesaria, a menos que la hinchazn de la lengua sea grave.  Tambin es importante una buena higiene bucal. Esto significa que debe cepillarse los dientes por lo menos dos veces al da y pasar diariamente el hilo dental para un buen tratamiento y prevencin.  Los corticoides como la prednisona pueden administrarse para disminuir el enrojecimiento y Youth workerla irritacin.  En caso de existir infeccin, le prescribirn medicamentos.  Tambin se tratarn otros problemas como  la anemia o los dficit nutricionales. Esto puede consistir en una modificacin de la dieta o en la administracin de suplementos vitamnicos. Evite las comidas muy condimentadas, el alcohol y el tabaco. Esto disminuir las Sinaimolestias.  Evite todo lo que sea irritante para su boca o lengua. La glositis generalmente responde bien si se suprime la causa o se implementa un tratamiento.  SOLICITE ATENCIN MDICA DE INMEDIATO SI:  Los sntomas de glositis persisten durante ms de [employer]10 das.  La hinchazn de la lengua es grave y presenta dificultad para respirar, Heritage managerhablar, Product managermasticar o tragar.  No obtiene Saks Incorporatedalivio de los medicamentos.  Presenta dificultades respiratorias. Document Released: 05/19/2005 Document Revised: 08/11/2011 [facility] Patient Information 2015 [place], [place]. This information is not intended to replace advice given to you by your health care provider. Make sure you discuss any questions you have with your health care provider.

## 2014-09-23 ENCOUNTER — Ambulatory Visit (INDEPENDENT_AMBULATORY_CARE_PROVIDER_SITE_OTHER): Payer: 59 | Admitting: Emergency Medicine

## 2014-09-23 VITALS — BP 100/60 | HR 78 | Temp 97.9°F | Resp 16 | Ht 62.75 in | Wt 122.4 lb

## 2014-09-23 DIAGNOSIS — K140 Glossitis: Secondary | ICD-10-CM | POA: Diagnosis not present

## 2014-09-23 NOTE — Progress Notes (Signed)
[facility] [address], [place] [address] [phone]  Date:  09/23/2014   Name:  Sherry Houston   DOB:  10/27/1985   MRN:  [id number]  PCP:  No PCP Per Patient    Chief Complaint: tongue pain   History of Present Illness:  Sherry Houston is a 29 y.o. very pleasant female patient who presents with the following:  Seen for inflamed tongue and treated with pen vk  Has not improved No fever or chills No cough or coryza. No dental pain No improvement with over the counter medications or other home remedies. Denies other complaint or health concern today.   Patient Active Problem List   Diagnosis Date Noted  . Glossitis 09/02/2014    Past Medical History  Diagnosis Date  . Medical history non-contributory   . Obstetrical blood clot embolism in second trimester     Past Surgical History  Procedure Laterality Date  . No past surgeries    . Engorgement      made incision in breast to drain @ 42mo Post poartum    History  Substance Use Topics  . Smoking status: Never Smoker   . Smokeless tobacco: Never Used  . Alcohol Use: No    Family History  Problem Relation Age of Onset  . Heart failure Sister     No Known Allergies  Medication list has been reviewed and updated.  Current Outpatient Prescriptions on File Prior to Visit  Medication Sig Dispense Refill  . Alum & Mag Hydroxide-Simeth (MAGIC MOUTHWASH) SOLN Take 5 mLs by mouth 3 (three) times daily. 100 mL 0  . penicillin v potassium (VEETID) 500 MG tablet Take 1 tablet (500 mg total) by mouth 3 (three) times daily. (Patient not taking: Reported on 09/23/2014) 30 tablet 0   No current facility-administered medications on file prior to visit.    Review of Systems:  As per HPI, otherwise negative.    Physical Examination: Filed Vitals:   09/23/14 1608  BP: 100/60  Pulse: 78  Temp: 97.9 F (36.6 C)  Resp: 16   Filed Vitals:   09/23/14 1608  Height: 5' 2.75"  (1.594 m)  Weight: 122 lb 6 oz (55.509 kg)   Body mass index is 21.85 kg/(m^2). Ideal Body Weight: Weight in (lb) to have BMI = 25: 139.7  GEN: WDWN, NAD, Non-toxic, A & O x 3 HEENT: Atraumatic, Normocephalic. Neck supple. No masses, No LAD.  No intraoral lesions.  Very small red raised papillae on tongue.  No odor. Ears and Nose: No external deformity. CV: RRR, No M/G/R. No JVD. No thrill. No extra heart sounds. PULM: CTA B, no wheezes, crackles, rhonchi. No retractions. No resp. distress. No accessory muscle use. ABD: S, NT, ND, +BS. No rebound. No HSM. EXTR: No c/c/e NEURO Normal gait.  PSYCH: Normally interactive. Conversant. Not depressed or anxious appearing.  Calm demeanor.    Assessment and Plan: Glossitis Dentist  Signed,  Phillips OdorJeffery Jahfari Ambers, MD

## 2014-09-23 NOTE — Patient Instructions (Signed)
Glositis (Glossitis) La glositis es una inflamacin de la [place]. Los Baker Hughes Incorporatedcambios en el aspecto de la lengua pueden deberse a un trastorno primario de este rgano. Esto significa que el problema se encuentra slo en la lengua. Tambin puede ser el sntoma de otros trastornos.  CAUSAS  Infecciones  Alergias mltiples  Consumo excesivo de alcohol  Lesiones mecnicas  Anemia  Consumo de tabaco y nicotina  Comidas muy condimentadas  Dficit de vitamina B  Lesiones producidas por sustancias qumicas o bebidas o alimentos calientes SNTOMAS Puede haber hinchazn y [employer] Inccambios en el color. Algunas veces la superficie de la lengua puede verse lisa. Este trastorno puede ser indoloro. Pero generalmente la lengua duele y est sensible. Puede estar de color rojo ardiente si el problema est ocasionado por un dficit de vitamina B. Si est plida puede tratarse de anemia. Anemia significa que no hay suficientes glbulos rojos en la sangre. Puede haber problemas para Product managermasticar, tragar y hablar. En algunos casos, la glositis puede dar como resultado una hinchazn grave que bloquea el paso del [place]. DIAGNSTICO El diagnstico de la glositis se realiza a travs del examen fsico y del interrogatorio. Podrn realizarle anlisis de [place]sangre. TRATAMIENTO  El objetivo del tratamiento es reducir la inflamacin. Generalmente la hospitalizacin no es necesaria, a menos que la hinchazn de la lengua sea grave.  Tambin es importante una buena higiene bucal. Esto significa que debe cepillarse los dientes por lo menos dos veces al da y pasar diariamente el hilo dental para un buen tratamiento y prevencin.  Los corticoides como la prednisona pueden administrarse para disminuir el enrojecimiento y Youth workerla irritacin.  En caso de existir infeccin, le prescribirn medicamentos.  Tambin se tratarn otros problemas como la anemia o los dficit nutricionales. Esto puede consistir en una modificacin de la dieta o en la  administracin de suplementos vitamnicos. Evite las comidas muy condimentadas, el alcohol y el tabaco. Esto disminuir las Holcombmolestias.  Evite todo lo que sea irritante para su boca o lengua. La glositis generalmente responde bien si se suprime la causa o se implementa un tratamiento.  SOLICITE ATENCIN MDICA DE INMEDIATO SI:  Los sntomas de glositis persisten durante ms de [employer]10 das.  La hinchazn de la lengua es grave y presenta dificultad para respirar, Heritage managerhablar, Product managermasticar o tragar.  No obtiene Saks Incorporatedalivio de los medicamentos.  Presenta dificultades respiratorias. Document Released: 05/19/2005 Document Revised: 08/11/2011 [facility]Exit[facility] Patient Information 2015 [place]ExitCare, [place]LLC. This information is not intended to replace advice given to you by your health care provider. Make sure you discuss any questions you have with your health care provider.

## 2014-10-03 ENCOUNTER — Ambulatory Visit (INDEPENDENT_AMBULATORY_CARE_PROVIDER_SITE_OTHER): Payer: 59 | Admitting: Physician Assistant

## 2014-10-03 VITALS — BP 100/58 | HR 79 | Temp 97.7°F | Resp 16 | Ht 62.0 in | Wt 121.4 lb

## 2014-10-03 DIAGNOSIS — K130 Diseases of lips: Secondary | ICD-10-CM | POA: Diagnosis not present

## 2014-10-03 DIAGNOSIS — L298 Other pruritus: Secondary | ICD-10-CM | POA: Diagnosis not present

## 2014-10-03 DIAGNOSIS — R220 Localized swelling, mass and lump, head: Secondary | ICD-10-CM

## 2014-10-03 MED ORDER — PREDNISONE 20 MG PO TABS: ORAL_TABLET | ORAL | Status: DC

## 2014-10-03 NOTE — Progress Notes (Signed)
10/03/2014 at 9:15 PM  Sherry Houston / DOB: Aug 01, 1985 / MRN: [id number]  The patient has Glossitis on her problem list.  SUBJECTIVE  Chief complaint: Rash  Sherry Houston is a 29 y.o. female here today for generalized urticarial rash that started roughly 3 weeks ago.  She initially went to the ED in [place] and was given a shot of IM steroid and was placed on prednisone. The abated with this therapy.  Today was her last dose of PO medication and the rash has started to return.  She complains of itchiness and lower lip swelling today.  She denies tongue and throat swelling.  She does not complain of dizziness, palpitations, and diaphoresis. She denies tick bites and otherwise feels well. She reports the same rash occurred last year at this time. She denies any new medications and a change in hygiene products.   She denies a history of PUD, DM2, HTN, and kidney disease.   She  has a past medical history of Medical history non-contributory and Obstetrical blood clot embolism in second trimester.    Medications reviewed and updated by myself where necessary, and exist elsewhere in the encounter.   Ms. Sherry Houston has No Known Allergies. She  reports that she has never smoked. She has never used smokeless tobacco. She reports that she does not drink alcohol or use illicit drugs. She  reports that she currently engages in sexual activity. She reports using the following method of birth control/protection: Condom. The patient  has past surgical history that includes No past surgeries and engorgement.  Her family history includes Heart failure in her sister.  Review of Systems  Constitutional: Negative for fever and chills.  Respiratory: Negative for cough.   Cardiovascular: Negative for chest pain.  Gastrointestinal: Negative for nausea.  Genitourinary: Negative for dysuria.  Musculoskeletal: Negative for myalgias.  Neurological: Negative for dizziness and headaches.     OBJECTIVE  Her  height is  (1.575 m) and weight is 121 lb 6.4 oz (55.067 kg). Her oral temperature is 97.7 F (36.5 C). Her blood pressure is 100/58 and her pulse is 79. Her respiration is 16 and oxygen saturation is 98%.  The patient's body mass index is 22.2 kg/(m^2).  Physical Exam  Constitutional: She is oriented to person, place, and time. She appears well-developed and well-nourished. No distress.  Neck: Normal range of motion.  Cardiovascular: Normal rate and regular rhythm.   Respiratory: Effort normal and breath sounds normal. No respiratory distress.  GI: Soft. Bowel sounds are normal.  Musculoskeletal: Normal range of motion.  Neurological: She is alert and oriented to person, place, and time. No cranial nerve deficit.  Skin: Skin is warm and dry. She is not diaphoretic.  Generalized blanching pruritic rash characterized by wheels and flare.   Psychiatric: She has a normal mood and affect. Her behavior is normal. Judgment and thought content normal.    No results found for this or any previous visit (from the past 24 hour(s)).  ASSESSMENT & PLAN  Sherry Houston was seen today for rash.  Diagnoses and all orders for this visit:  Pruritic erythematous rash: Patient has an appointment with an allergy clinic and plans to follow up with them.  Orders: -     predniSONE (DELTASONE) 20 MG tablet; Take 3 PO QAM x3days, 2 PO QAM x3days, 1 PO QAM x3days  Lip swelling Orders: -     predniSONE (DELTASONE) 20 MG tablet; Take 3 PO QAM x3days, 2 PO  QAM x3days, 1 PO QAM x3days   The patient was advised to call or come back to clinic if she does not see an improvement in symptoms, or worsens with the above plan.   Sherry Houston, MHS, PA-C [facility] 10/03/2014 9:15 PM

## 2015-01-14 IMAGING — US US FETAL BPP W/O NONSTRESS
1 series · 14 of 14 positions shown · non-contrast
Comparison: none

[Series 1: us fetal bpp w/o nonstress · non-contrast · 14 acquisitions, 14 frames shown]
[im 1/14]
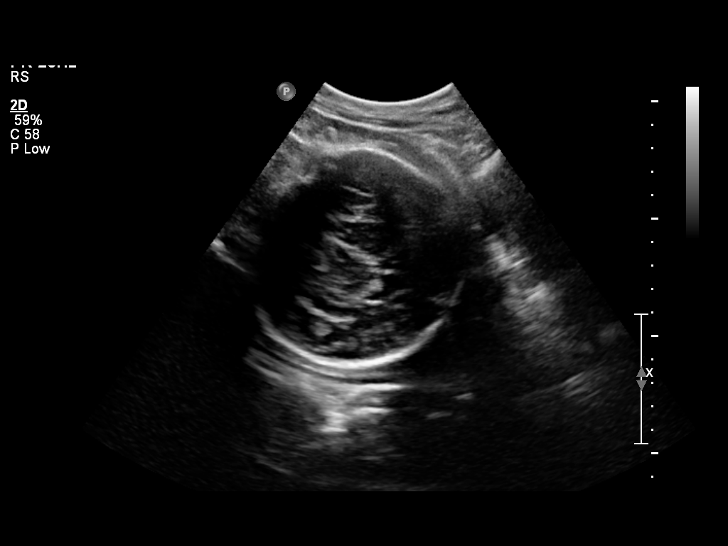
[im 2/14]
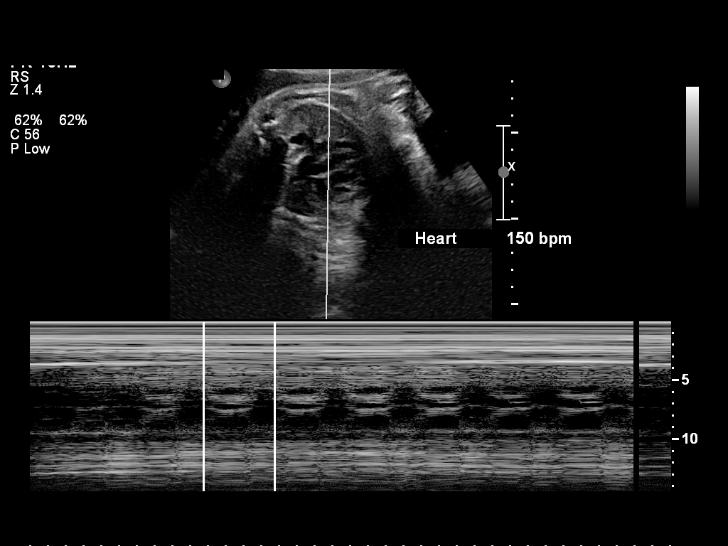
[im 3/14]
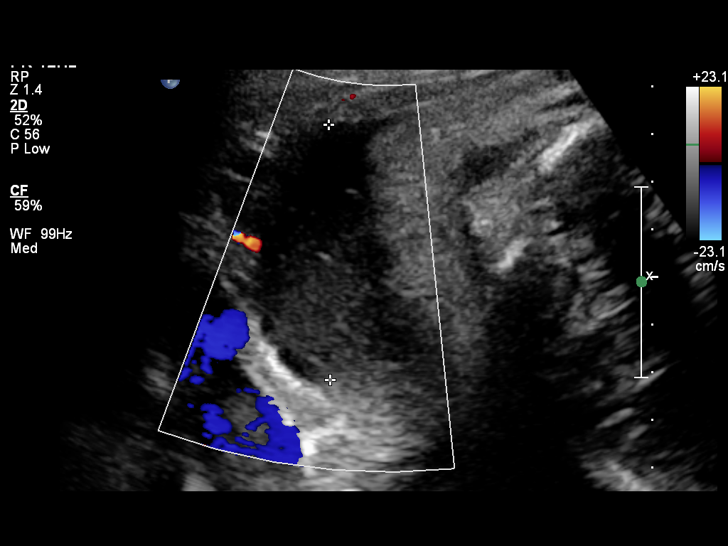
[im 4/14]
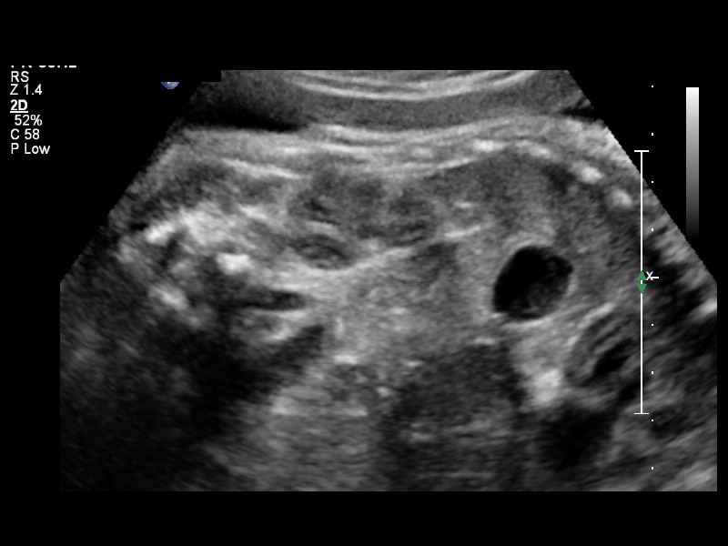
[im 5/14]
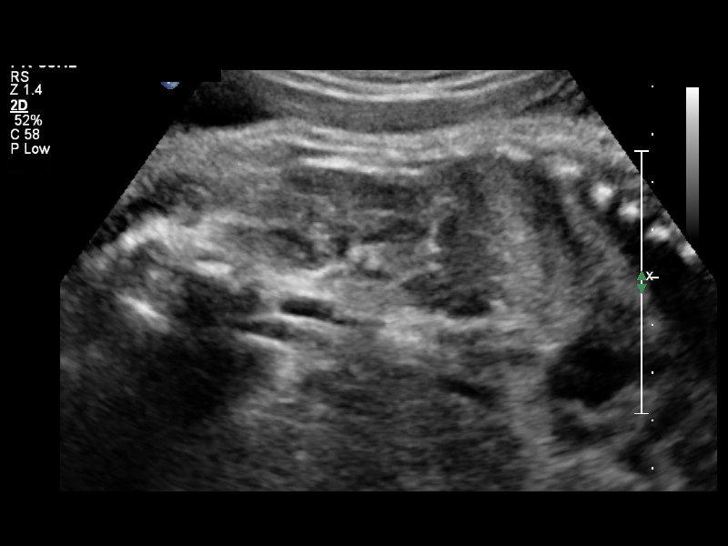
[im 6/14]
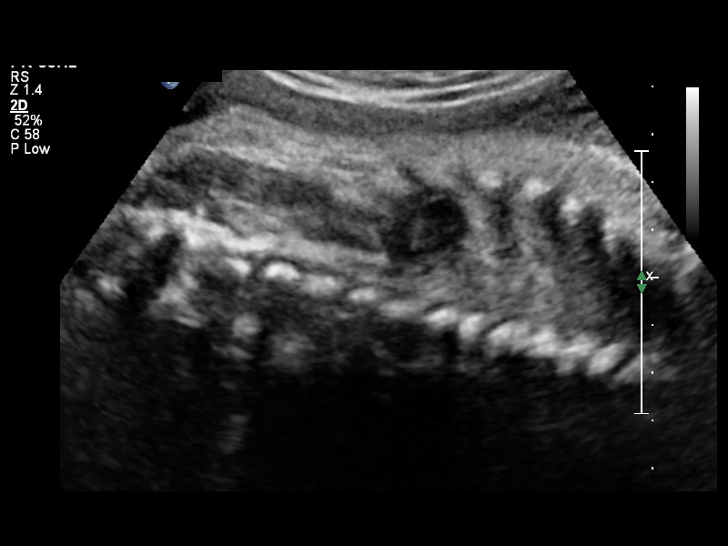
[im 7/14]
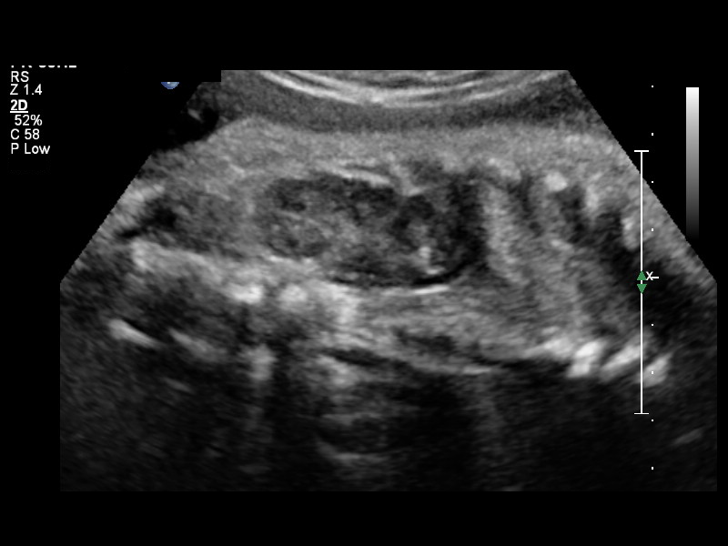
[im 8/14]
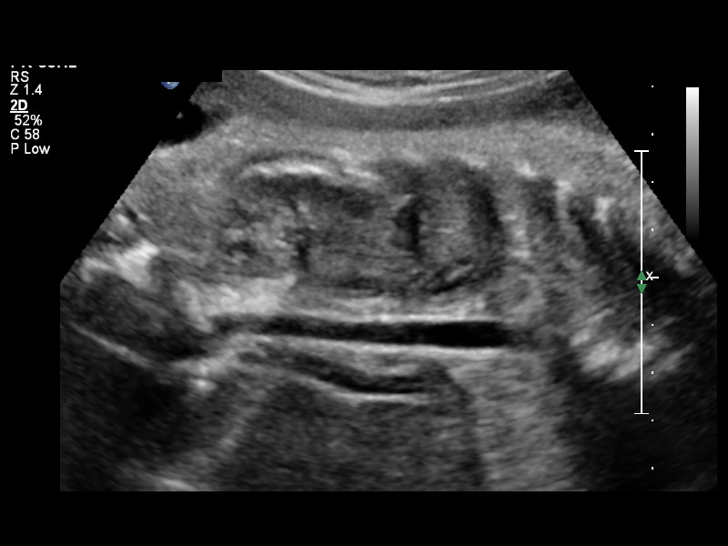
[im 9/14]
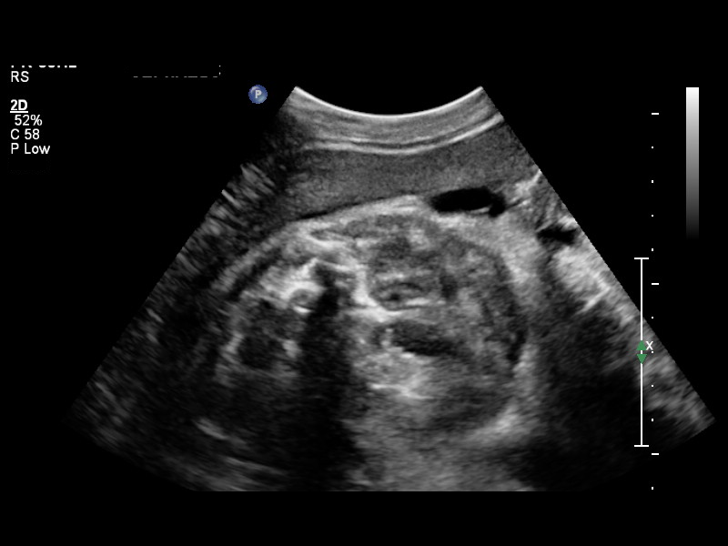
[im 10/14]
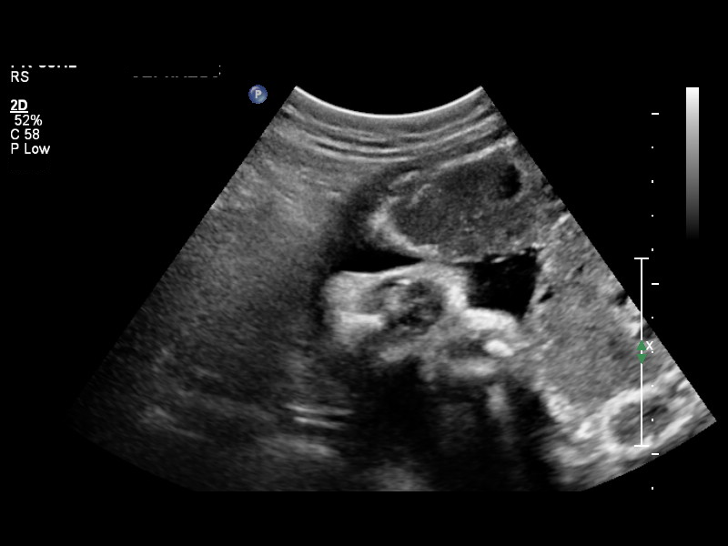
[im 11/14]
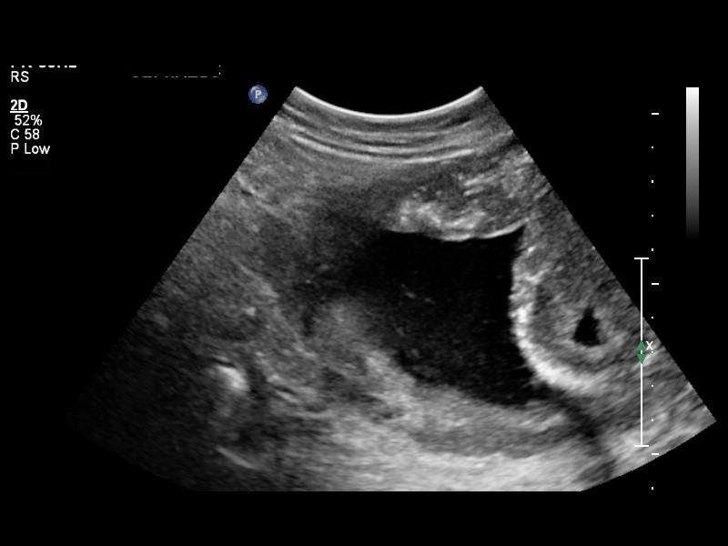
[im 12/14]
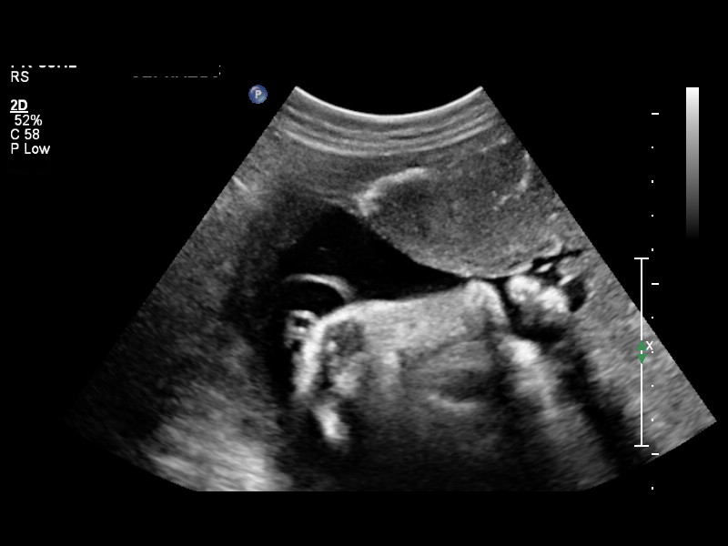
[im 13/14]
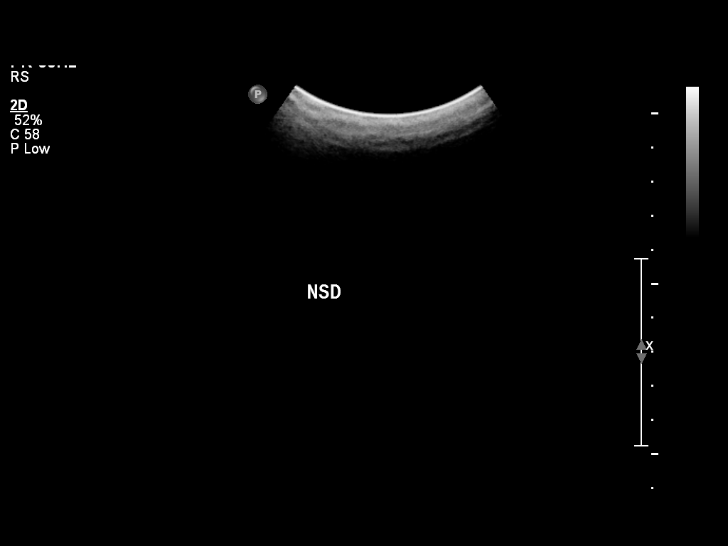
[im 14/14]
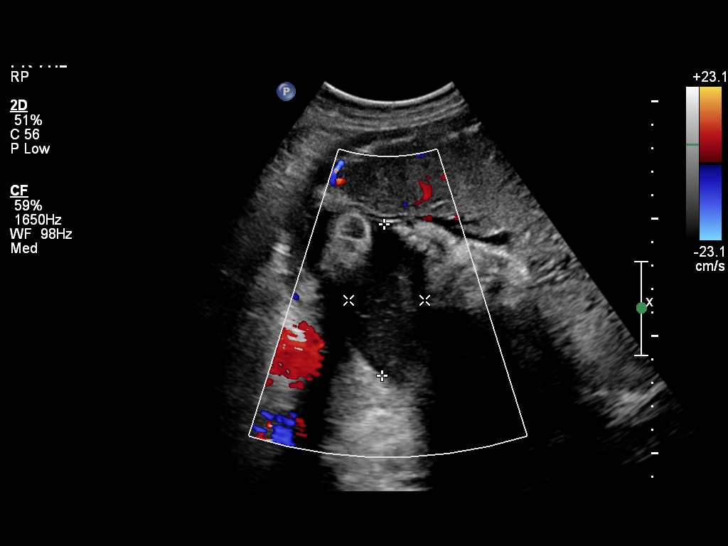

[14 of 14 positions shown; findings below may reference images not displayed]

OBSTETRICS REPORT
                      (Signed Final 06/15/2013 [DATE])

Service(s) Provided

Indications

 Deep vein thrombosis (DVT) - on lovenox
Fetal Evaluation

 Num Of Fetuses:    1
 Fetal Heart Rate:  150                          bpm
 Cardiac Activity:  Observed
 Presentation:      Cephalic

 Comment:    BPP [DATE] in 9min

 Amniotic Fluid
 AFI FV:      Subjectively within normal limits
                                             Larg Pckt:     6.4  cm
Biophysical Evaluation

 Amniotic F.V:   Within normal limits       F. Tone:        Observed
 F. Movement:    Observed                   Score:          [DATE]
 F. Breathing:   Observed
Gestational Age

 LMP:           39w 3d        Date:  09/12/12                 EDD:   06/19/13
 Best:          36w 6d     Det. By:  Previous Ultrasound      EDD:   07/07/13
Impression

 Single IUP at 36 [DATE] weeks
 Hx of DVT on Lovinox (full dose)
 Active fetus with BPP of [DATE]
Recommendations

 Continue weekly BPPs.
 Plan scheduled induction at 39 weeks - would hold Lovinox
 dose the AM of scheduled induction and resume 6 hours post
 partum (if SVD) or 12 hrs post partum if cesarean delivery.

## 2015-01-21 IMAGING — US US FETAL BPP W/O NONSTRESS
1 series · 13 of 27 positions shown · non-contrast
Comparison: none

[Series 1: us fetal bpp w/o nonstress · non-contrast · 27 acquisitions, 13 frames shown]
[im 2/27]
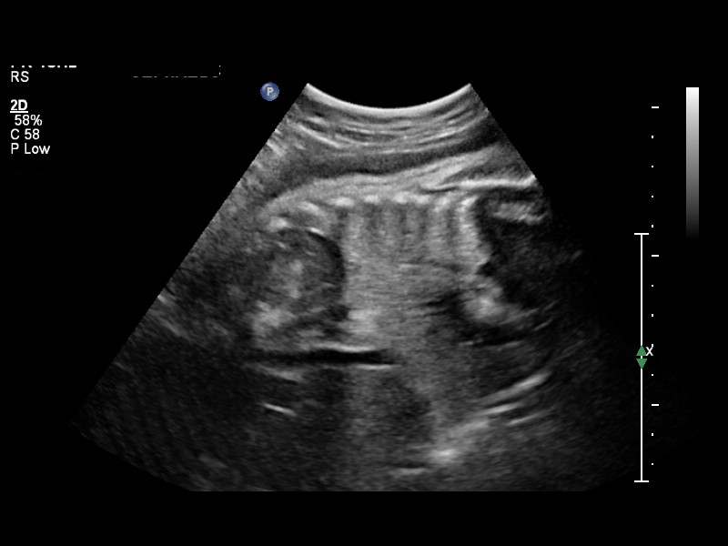
[im 4/27]
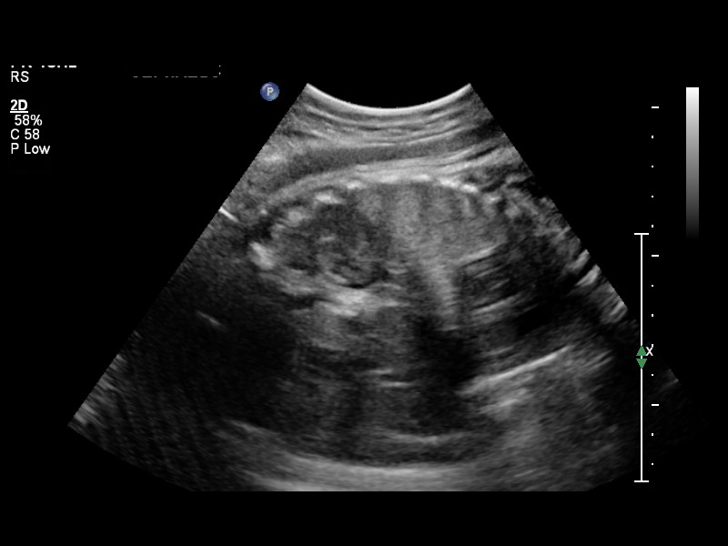
[im 6/27]
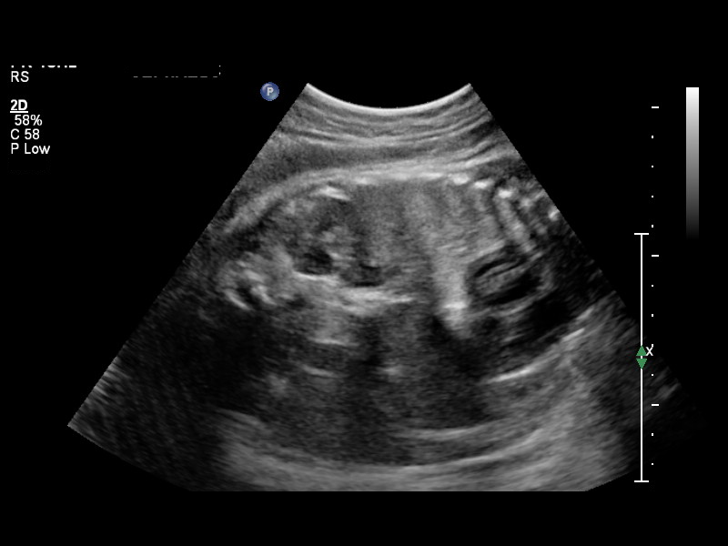
[im 8/27]
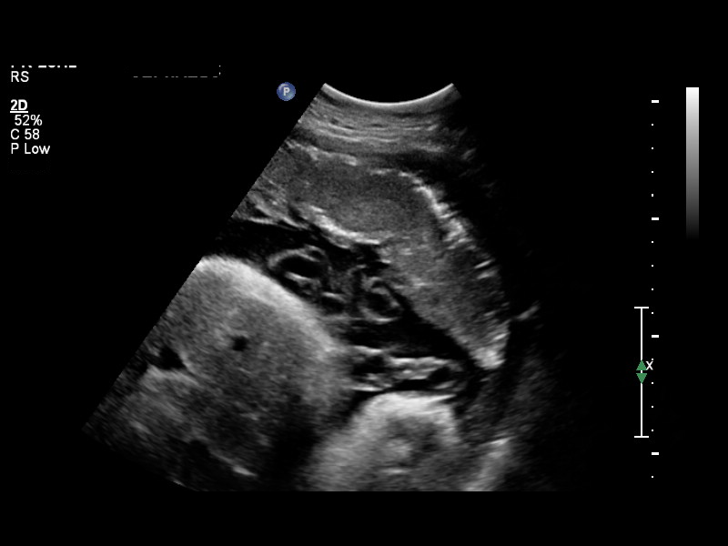
[im 10/27]
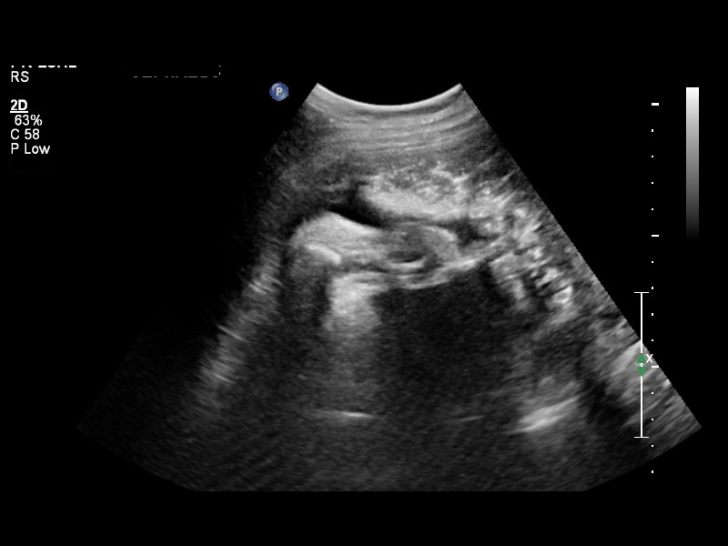
[im 12/27]
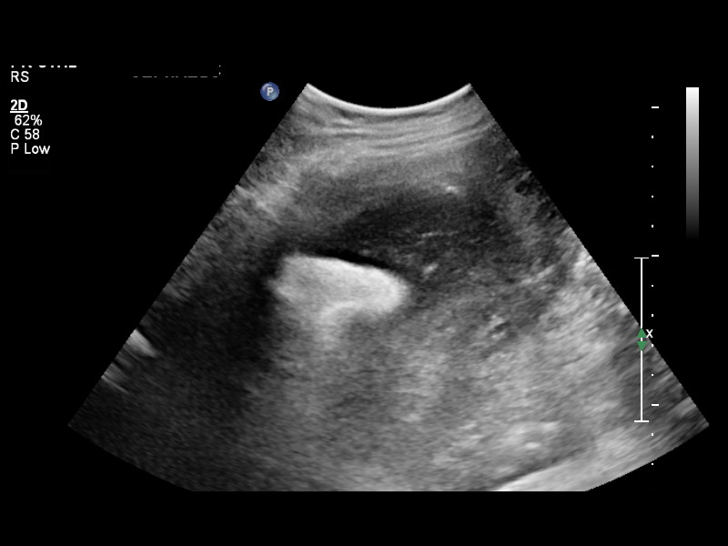
[im 14/27]
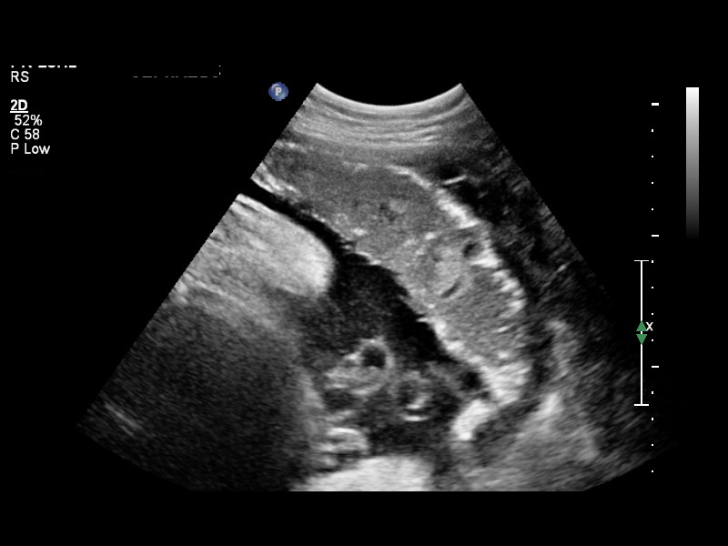
[im 16/27]
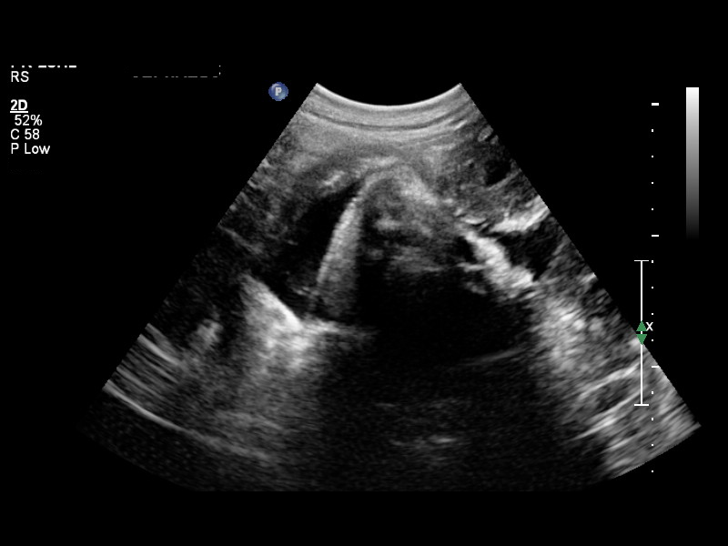
[im 18/27]
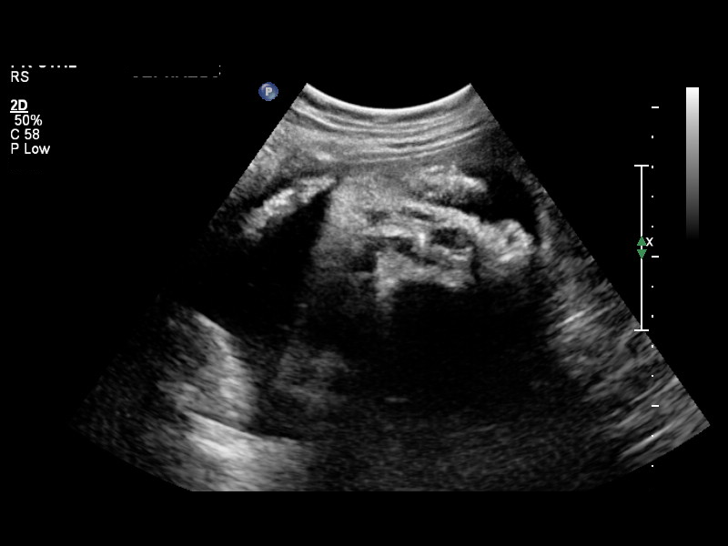
[im 20/27]
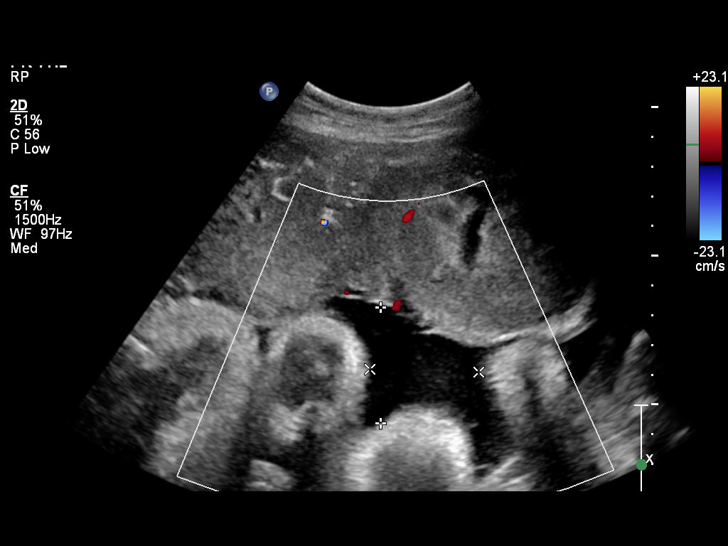
[im 22/27]
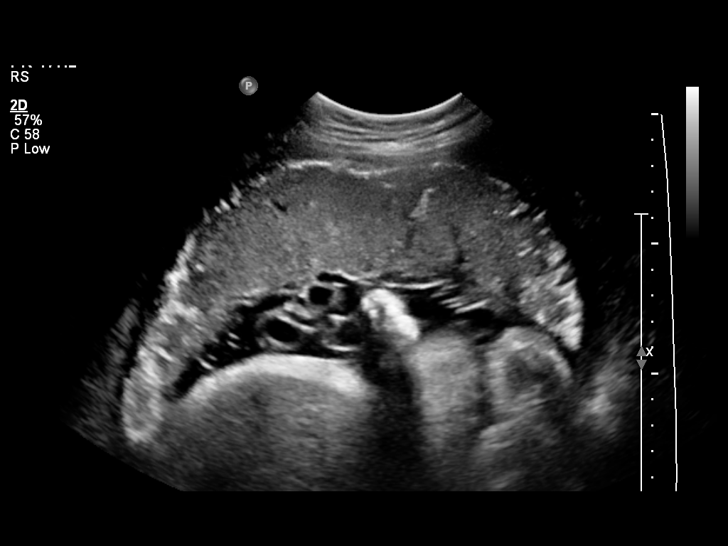
[im 24/27]
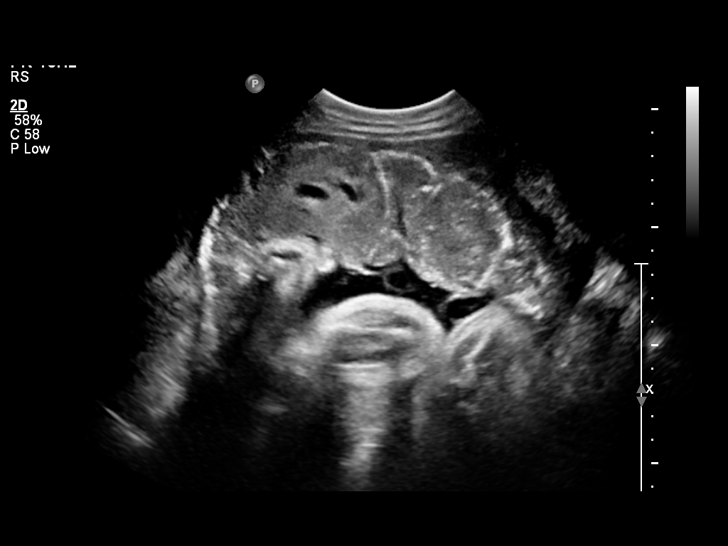
[im 26/27]
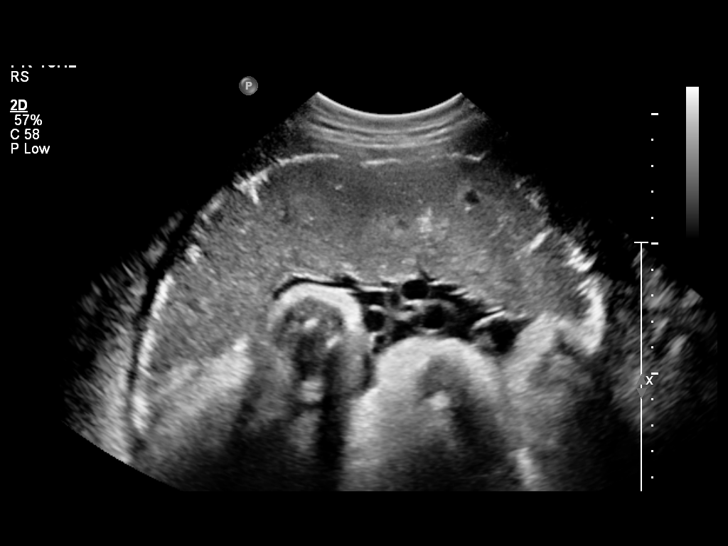

[13 of 27 positions shown; findings below may reference images not displayed]

OBSTETRICS REPORT
                      (Signed Final 06/22/2013 [DATE])

Service(s) Provided

Indications

 Deep vein thrombosis (DVT) - on lovenox
 Assess fetal well-being
Fetal Evaluation

 Num Of Fetuses:    1
 Fetal Heart Rate:  152                          bpm
 Cardiac Activity:  Observed
 Presentation:      Cephalic
 Placenta:          Anterior, above cervical os

 Amniotic Fluid
 AFI FV:      Subjectively within normal limits
                                             Larg Pckt:    8.97  cm
Biophysical Evaluation

 Amniotic F.V:   Within normal limits       F. Tone:        Observed
 F. Movement:    Observed                   Score:          [DATE]
 F. Breathing:   Observed
Gestational Age

 LMP:           40w 3d        Date:  09/12/12                 EDD:   06/19/13
 Best:          37w 6d     Det. By:  Previous Ultrasound      EDD:   07/07/13
Impression

 Single IUP at 37 [DATE] weeks
 Hx of DVT on Lovinox (full dose)
 Active fetus with BPP of [DATE]
Recommendations

 Continue weekly BPPs.
 Plan scheduled induction at 39 weeks - would hold Lovinox
 dose the AM of scheduled induction and resume 6 hours post
 partum (if SVD) or 12 hrs post partum if cesarean delivery.

## 2017-06-02 NOTE — L&D Delivery Note (Signed)
Operative Delivery Note At  a viable female was delivered via KIWI vac at crowning station.  Presentation: vertex; Position: Left,, Occiput,, Anterior; Station: +3.  Verbal consent: obtained from patient.  Risks and benefits discussed in detail.  Risks include, but are not limited to the risks of anesthesia, bleeding, infection, damage to maternal tissues, fetal cephalhematoma.  There is also the risk of inability to effect vaginal delivery of the head, or shoulder dystocia that cannot be resolved by established maneuvers, leading to the need for emergency cesarean section.  APGAR:9 , ; weight  .   Placenta status:spont ,shultx .   Cord:  with the following complications: .  Cord pH: n/a  Anesthesia:  epidural Instruments: kiwi vac Episiotomy:  none Lacerations:  2nd. Pt had walnut size cyst removed fron outter part og laceration and sent to pathology. Suture Repair: 2.0 vicryl Est. Blood Loss 400(mL):    Mom to postpartum.  Baby to Couplet care / Skin to Skin.  Sherry Houston 01/17/2018, 7:35 PM

## 2017-07-23 ENCOUNTER — Other Ambulatory Visit (HOSPITAL_COMMUNITY): Payer: Self-pay | Admitting: Nurse Practitioner

## 2017-07-23 ENCOUNTER — Other Ambulatory Visit: Payer: Self-pay | Admitting: Nurse Practitioner

## 2017-07-23 LAB — CYSTIC FIBROSIS DIAGNOSTIC STUDY: INTERPRETATION-CFDNA: NEGATIVE

## 2017-07-23 LAB — OB RESULTS CONSOLE HIV ANTIBODY (ROUTINE TESTING): HIV: NONREACTIVE

## 2017-07-23 LAB — OB RESULTS CONSOLE GC/CHLAMYDIA
Chlamydia: NEGATIVE
GC PROBE AMP, GENITAL: NEGATIVE

## 2017-07-23 LAB — OB RESULTS CONSOLE RUBELLA ANTIBODY, IGM: Rubella: IMMUNE

## 2017-07-23 LAB — CYTOLOGY - PAP: PAP SMEAR: NEGATIVE

## 2017-07-23 LAB — SICKLE CELL SCREEN: SICKLE CELL SCREEN: NORMAL

## 2017-07-23 LAB — OB RESULTS CONSOLE HEPATITIS B SURFACE ANTIGEN: Hepatitis B Surface Ag: NEGATIVE

## 2017-07-23 LAB — OB RESULTS CONSOLE ABO/RH: RH TYPE: POSITIVE

## 2017-07-23 LAB — OB RESULTS CONSOLE ANTIBODY SCREEN: Antibody Screen: NEGATIVE

## 2017-07-23 LAB — OB RESULTS CONSOLE HGB/HCT, BLOOD
HCT: 37
Hemoglobin: 13.1

## 2017-07-23 LAB — OB RESULTS CONSOLE RPR: RPR: NONREACTIVE

## 2017-07-23 LAB — GLUCOSE, 1 HOUR: GLUCOSE 1 HOUR: 85

## 2017-07-23 LAB — OB RESULTS CONSOLE PLATELET COUNT: Platelets: 185

## 2017-07-23 LAB — OB RESULTS CONSOLE VARICELLA ZOSTER ANTIBODY, IGG: VARICELLA IGG: IMMUNE

## 2017-07-23 LAB — CULTURE, OB URINE: URINE CULTURE, OB: NEGATIVE

## 2017-07-24 ENCOUNTER — Other Ambulatory Visit (HOSPITAL_COMMUNITY): Payer: Self-pay | Admitting: Nurse Practitioner

## 2017-07-24 DIAGNOSIS — Z36 Encounter for antenatal screening for chromosomal anomalies: Secondary | ICD-10-CM

## 2017-08-06 ENCOUNTER — Encounter: Payer: Self-pay | Admitting: *Deleted

## 2017-08-11 DIAGNOSIS — O0990 Supervision of high risk pregnancy, unspecified, unspecified trimester: Secondary | ICD-10-CM | POA: Insufficient documentation

## 2017-08-12 ENCOUNTER — Encounter: Payer: Self-pay | Admitting: Obstetrics and Gynecology

## 2017-08-12 ENCOUNTER — Ambulatory Visit (INDEPENDENT_AMBULATORY_CARE_PROVIDER_SITE_OTHER): Payer: Self-pay | Admitting: Obstetrics and Gynecology

## 2017-08-12 DIAGNOSIS — Z8759 Personal history of other complications of pregnancy, childbirth and the puerperium: Secondary | ICD-10-CM

## 2017-08-12 DIAGNOSIS — O0990 Supervision of high risk pregnancy, unspecified, unspecified trimester: Secondary | ICD-10-CM

## 2017-08-12 DIAGNOSIS — Z86718 Personal history of other venous thrombosis and embolism: Secondary | ICD-10-CM | POA: Insufficient documentation

## 2017-08-12 MED ORDER — ENOXAPARIN SODIUM 40 MG/0.4ML ~~LOC~~ SOLN: 40.0000 mg | SUBCUTANEOUS | 6 refills | Status: DC

## 2017-08-12 NOTE — Progress Notes (Signed)
   PRENATAL VISIT NOTE  Subjective:  Sherry Houston is a 32 y.o. G2P1001 at 7259w4d being seen today for ongoing prenatal care. Patient transferred care from the health department due to a history of DVT during her first pregnancy.  She is currently monitored for the following issues for this high-risk pregnancy and has Glossitis; Supervision of high risk pregnancy, antepartum; and Maternal DVT (deep vein thrombosis), history of on their problem list.  Patient reports no complaints.  Contractions: Not present. Vag. Bleeding: None.  Movement: Present. Denies leaking of fluid.   The following portions of the patient's history were reviewed and updated as appropriate: allergies, current medications, past family history, past medical history, past social history, past surgical history and problem list. Problem list updated.  Objective:   Vitals:   08/12/17 1420  BP: 115/66  Pulse: 98  Weight: 121 lb 6.4 oz (55.1 kg)    Fetal Status: Fetal Heart Rate (bpm): 160   Movement: Present     General:  Alert, oriented and cooperative. Patient is in no acute distress.  Skin: Skin is warm and dry. No rash noted.   Cardiovascular: Normal heart rate noted  Respiratory: Normal respiratory effort, no problems with respiration noted  Abdomen: Soft, gravid, appropriate for gestational age.  Pain/Pressure: Present     Pelvic: Cervical exam deferred        Extremities: Normal range of motion.  Edema: None  Mental Status:  Normal mood and affect. Normal behavior. Normal judgment and thought content.   Assessment and Plan:  Pregnancy: G2P1001 at 4159w4d  1. Supervision of high risk pregnancy, antepartum Patient is doing well without complaints Anatomy ultrasound scheduled for 3/28  2. Maternal DVT (deep vein thrombosis), history of Rx prophylaxis lovenox provided Education provided by RN on how to self administer  Preterm labor symptoms and general obstetric precautions including but not limited to  vaginal bleeding, contractions, leaking of fluid and fetal movement were reviewed in detail with the patient. Please refer to After Visit Summary for other counseling recommendations.  Return in about 4 weeks (around 09/09/2017) for ROB.   Catalina AntiguaPeggy Saburo Luger, MD

## 2017-08-14 ENCOUNTER — Telehealth: Payer: Self-pay | Admitting: Obstetrics and Gynecology

## 2017-08-14 NOTE — Telephone Encounter (Signed)
Patient called to say she was not able to pick up her medication form [facility] on [address] Because every time she went they told her it wasn't there. After looking in her chart. I could see her prescription was there, and had been received. With an Interpreter on the line, I called [facility] and spoke with Tiffany. She ensured the patient her prescription was there, and ready for pick-up. Tiffany instructed the patient to bring her medicaid card, and where she should come to when she gets there. I informed the patient to ask for Tiffany since she knew about what was discussed.

## 2017-08-18 ENCOUNTER — Telehealth: Payer: Self-pay | Admitting: *Deleted

## 2017-08-18 NOTE — Telephone Encounter (Signed)
Received a voicemail from ChatsworthStephanie, RN with [facility] from 08/13/17 12:41pm stating the patient called today and reports she was seen there yesterday and the lovenox is not at her pharmacy- [facility] [address]- pharmacy is [phone] and patient phone [phone]

## 2017-08-21 ENCOUNTER — Encounter (HOSPITAL_COMMUNITY): Payer: Self-pay | Admitting: Nurse Practitioner

## 2017-08-21 NOTE — Telephone Encounter (Signed)
Sherry JamesCalled Shanetha with [employer] [phone] and confirmed she has gotten her lovenox and she states she has started it. Also reviewed her next ob and us appointments with her.

## 2017-08-27 ENCOUNTER — Ambulatory Visit (HOSPITAL_COMMUNITY)
Admission: RE | Admit: 2017-08-27 | Discharge: 2017-08-27 | Disposition: A | Discharge status: Home / Self Care | Payer: Medicaid Other | Source: Ambulatory Visit | Attending: Nurse Practitioner | Admitting: Nurse Practitioner

## 2017-08-27 ENCOUNTER — Encounter (HOSPITAL_COMMUNITY): Payer: Self-pay

## 2017-08-27 ENCOUNTER — Other Ambulatory Visit (HOSPITAL_COMMUNITY): Payer: Self-pay | Admitting: Nurse Practitioner

## 2017-08-27 DIAGNOSIS — Z8759 Personal history of other complications of pregnancy, childbirth and the puerperium: Secondary | ICD-10-CM

## 2017-08-27 DIAGNOSIS — Z86718 Personal history of other venous thrombosis and embolism: Secondary | ICD-10-CM

## 2017-08-27 DIAGNOSIS — Z3A18 18 weeks gestation of pregnancy: Secondary | ICD-10-CM

## 2017-08-27 DIAGNOSIS — O09299 Supervision of pregnancy with other poor reproductive or obstetric history, unspecified trimester: Secondary | ICD-10-CM

## 2017-08-27 DIAGNOSIS — O09292 Supervision of pregnancy with other poor reproductive or obstetric history, second trimester: Secondary | ICD-10-CM | POA: Diagnosis not present

## 2017-08-27 DIAGNOSIS — O0990 Supervision of high risk pregnancy, unspecified, unspecified trimester: Secondary | ICD-10-CM

## 2017-08-27 DIAGNOSIS — Z3689 Encounter for other specified antenatal screening: Secondary | ICD-10-CM | POA: Diagnosis present

## 2017-08-27 DIAGNOSIS — Z36 Encounter for antenatal screening for chromosomal anomalies: Secondary | ICD-10-CM

## 2017-08-27 NOTE — Addendum Note (Signed)
Encounter addended by: Marcellina MillinMoser, Xandria Gallaga L, RTR on: 08/27/2017 3:59 PM  Actions taken: Imaging Exam ended

## 2017-08-28 ENCOUNTER — Other Ambulatory Visit (HOSPITAL_COMMUNITY): Payer: Self-pay | Admitting: *Deleted

## 2017-08-28 DIAGNOSIS — Z362 Encounter for other antenatal screening follow-up: Secondary | ICD-10-CM

## 2017-09-10 ENCOUNTER — Ambulatory Visit (INDEPENDENT_AMBULATORY_CARE_PROVIDER_SITE_OTHER): Payer: Medicaid Other | Admitting: Obstetrics and Gynecology

## 2017-09-10 ENCOUNTER — Encounter: Payer: Self-pay | Admitting: Obstetrics and Gynecology

## 2017-09-10 VITALS — BP 113/60 | HR 92 | Wt 126.1 lb

## 2017-09-10 DIAGNOSIS — Z789 Other specified health status: Secondary | ICD-10-CM | POA: Insufficient documentation

## 2017-09-10 DIAGNOSIS — O0990 Supervision of high risk pregnancy, unspecified, unspecified trimester: Secondary | ICD-10-CM

## 2017-09-10 DIAGNOSIS — Z86718 Personal history of other venous thrombosis and embolism: Secondary | ICD-10-CM

## 2017-09-10 DIAGNOSIS — Z8759 Personal history of other complications of pregnancy, childbirth and the puerperium: Secondary | ICD-10-CM

## 2017-09-10 NOTE — Patient Instructions (Signed)
Hydrocortisone cream  Benadryl cream  Hydrocortisone cream  Benadryl cream

## 2017-09-10 NOTE — Progress Notes (Signed)
Prenatal Visit Note Date: 09/10/2017 Clinic: [facility]  Subjective:  Sherry Houston is a 32 y.o. G2P1001 at 1174w5d being seen today for ongoing prenatal care.  She is currently monitored for the following issues for this high-risk pregnancy and has Glossitis; Supervision of high risk pregnancy, antepartum; and Maternal DVT (deep vein thrombosis), history of on their problem list.  Patient reports no complaints.   Contractions: Irritability. Vag. Bleeding: None.  Movement: Present. Denies leaking of fluid.   The following portions of the patient's history were reviewed and updated as appropriate: allergies, current medications, past family history, past medical history, past social history, past surgical history and problem list. Problem list updated.  Objective:   Vitals:   09/10/17 1533  BP: 113/60  Pulse: 92  Weight: 126 lb 1.6 oz (57.2 kg)    Fetal Status: Fetal Heart Rate (bpm): 169   Movement: Present     General:  Alert, oriented and cooperative. Patient is in no acute distress.  Skin: Skin is warm and dry. No rash noted.   Cardiovascular: Normal heart rate noted  Respiratory: Normal respiratory effort, no problems with respiration noted  Abdomen: Soft, gravid, appropriate for gestational age. Pain/Pressure: Present     Pelvic:  Cervical exam deferred        Extremities: Normal range of motion.  Edema: None  Mental Status: Normal mood and affect. Normal behavior. Normal judgment and thought content.   Urinalysis:      Assessment and Plan:  Pregnancy: G2P1001 at 8674w5d  1. Supervision of high risk pregnancy, antepartum Routine care. Declines genetics. F/u completion anatomy u/s  - Protein C activity - Factor V Leiden - Antithrombin panel - Lupus anticoagulant - Cardiolipin antibodies, IgM+IgG - Beta-2 glycoprotein antibodies - CBC  2. Maternal DVT (deep vein thrombosis), history of Looking in chart, doesn't appear she's had w/u done. Will  get today. Pt confirms doing lovenox 40 qday.  - Protein C activity - Factor V Leiden - Antithrombin panel - Lupus anticoagulant - Cardiolipin antibodies, IgM+IgG - Beta-2 glycoprotein antibodies - CBC  Interpreter used   Preterm labor symptoms and general obstetric precautions including but not limited to vaginal bleeding, contractions, leaking of fluid and fetal movement were reviewed in detail with the patient. Please refer to After Visit Summary for other counseling recommendations.  Return in about 3 weeks (around 10/01/2017).   [place] BingPickens, Kadi Hession, MD

## 2017-09-16 ENCOUNTER — Encounter: Payer: Self-pay | Admitting: Obstetrics and Gynecology

## 2017-09-16 DIAGNOSIS — D6851 Activated protein C resistance: Secondary | ICD-10-CM | POA: Insufficient documentation

## 2017-09-16 HISTORY — DX: Activated protein C resistance: D68.51

## 2017-09-16 LAB — ANTITHROMBIN PANEL
AT III AG PPP IMM-ACNC: 77 % (ref 72–124)
AntiThromb III Func: 99 % (ref 75–135)

## 2017-09-16 LAB — LUPUS ANTICOAGULANT
DPT CONFIRM RATIO: 0.93 ratio (ref 0.00–1.40)
DPT: 37.8 s (ref 0.0–55.0)
DRVVT: 27.6 s (ref 0.0–47.0)
PTT LA: 30.2 s (ref 0.0–51.9)
THROMBIN TIME: 15 s (ref 0.0–23.0)

## 2017-09-16 LAB — CBC
HEMOGLOBIN: 11.7 g/dL (ref 11.1–15.9)
Hematocrit: 35.2 % (ref 34.0–46.6)
MCH: 29.8 pg (ref 26.6–33.0)
MCHC: 33.2 g/dL (ref 31.5–35.7)
MCV: 90 fL (ref 79–97)
Platelets: 229 10*3/uL (ref 150–379)
RBC: 3.92 x10E6/uL (ref 3.77–5.28)
RDW: 14.7 % (ref 12.3–15.4)
WBC: 7.6 10*3/uL (ref 3.4–10.8)

## 2017-09-16 LAB — FACTOR V LEIDEN

## 2017-09-16 LAB — CARDIOLIPIN ANTIBODIES, IGM+IGG
Anticardiolipin IgG: <9 GPL U/mL (ref 0–14)
Anticardiolipin IgM: 9 MPL U/mL (ref 0–12)

## 2017-09-16 LAB — PROTEIN C ACTIVITY: PROTEIN C ACTIVITY: 145 % (ref 73–180)

## 2017-09-18 ENCOUNTER — Encounter (HOSPITAL_COMMUNITY): Payer: Self-pay | Admitting: *Deleted

## 2017-09-18 ENCOUNTER — Other Ambulatory Visit: Payer: Self-pay

## 2017-09-18 ENCOUNTER — Inpatient Hospital Stay (HOSPITAL_COMMUNITY)
Admission: AD | Admit: 2017-09-18 | Discharge: 2017-09-18 | Disposition: A | Discharge status: Home / Self Care | Payer: Medicaid Other | Source: Ambulatory Visit | Attending: Family Medicine | Admitting: Family Medicine

## 2017-09-18 DIAGNOSIS — O26899 Other specified pregnancy related conditions, unspecified trimester: Secondary | ICD-10-CM

## 2017-09-18 DIAGNOSIS — O26892 Other specified pregnancy related conditions, second trimester: Secondary | ICD-10-CM

## 2017-09-18 DIAGNOSIS — N898 Other specified noninflammatory disorders of vagina: Secondary | ICD-10-CM | POA: Insufficient documentation

## 2017-09-18 DIAGNOSIS — R109 Unspecified abdominal pain: Secondary | ICD-10-CM | POA: Diagnosis not present

## 2017-09-18 DIAGNOSIS — Z3A21 21 weeks gestation of pregnancy: Secondary | ICD-10-CM | POA: Diagnosis not present

## 2017-09-18 LAB — WET PREP, GENITAL
CLUE CELLS WET PREP: NONE SEEN
Sperm: NONE SEEN
TRICH WET PREP: NONE SEEN
Yeast Wet Prep HPF POC: NONE SEEN

## 2017-09-18 LAB — URINALYSIS, ROUTINE W REFLEX MICROSCOPIC
BILIRUBIN URINE: NEGATIVE
Glucose, UA: NEGATIVE mg/dL
HGB URINE DIPSTICK: NEGATIVE
KETONES UR: NEGATIVE mg/dL
NITRITE: NEGATIVE
PH: 7 (ref 5.0–8.0)
PROTEIN: NEGATIVE mg/dL
SPECIFIC GRAVITY, URINE: 1.002 — AB (ref 1.005–1.030)

## 2017-09-18 NOTE — MAU Note (Signed)
Pt is having pain in her umbilical area and lower abdomen. 3/10. Pt reports the umbilical pain increases when she urinates. She also reported DFM. When she called the after hours nurse line they told her to come in and that she should have 5 movements per hour. Pt is on Lovenox for DVT prevention, DVT in previous pregnancy.

## 2017-09-18 NOTE — MAU Provider Note (Signed)
History     CSN: [id number]  Arrival date and time: 09/18/17 1611   First Provider Initiated Contact with Patient 09/18/17 1931     Chief Complaint  Patient presents with  . Abdominal Pain   HPI Sherry Houston is a 32 y.o. G2P1001 at [redacted]w[redacted]d who presents with pain at her umbilicus. She states she felt some tightening and pain when she bent over. She states the pain has since resolved. She also reports an increase in her normal discharge. She reports feeling fetal movement. She was concerned because the call a nurse told her that the baby should be moving more than 5 times every hour.   OB History    Gravida  2   Para  1   Term  1   Preterm      AB      Living  1     SAB      TAB      Ectopic      Multiple      Live Births  1           Past Medical History:  Diagnosis Date  . Factor 5 Leiden mutation, heterozygous (HCC) 09/16/2017  . Obstetrical blood clot embolism in second trimester 2014   LLE. Diagnosed in first pregnancy    Past Surgical History:  Procedure Laterality Date  . NO PAST SURGERIES      Family History  Problem Relation Age of Onset  . Heart failure Sister     Social History   Tobacco Use  . Smoking status: Never Smoker  . Smokeless tobacco: Never Used  Substance Use Topics  . Alcohol use: No    Alcohol/week: 0.0 oz  . Drug use: No    Allergies: No Known Allergies  Medications Prior to Admission  Medication Sig Dispense Refill Last Dose  . enoxaparin (LOVENOX) 40 MG/0.4ML injection Inject 0.4 mLs (40 mg total) into the skin daily. 30 Syringe 6 09/17/2017 at Unknown time  . prenatal vitamin w/FE, FA (PRENATAL 1 + 1) 27-1 MG TABS tablet Take 1 tablet by mouth daily at 12 noon.   09/17/2017 at Unknown time  . penicillin v potassium (VEETID) 500 MG tablet Take 1 tablet (500 mg total) by mouth 3 (three) times daily. (Patient not taking: Reported on 09/23/2014) 30 tablet 0 Completed Course at Unknown time    Review of Systems   Constitutional: Negative.  Negative for fatigue and fever.  HENT: Negative.   Respiratory: Negative.  Negative for shortness of breath.   Cardiovascular: Negative.  Negative for chest pain.  Gastrointestinal: Positive for abdominal pain. Negative for constipation, diarrhea, nausea and vomiting.  Genitourinary: Positive for vaginal discharge. Negative for dysuria.  Neurological: Negative.  Negative for dizziness and headaches.   Physical Exam   Blood pressure 115/64, pulse 94, temperature 98.5 F (36.9 C), temperature source Oral, resp. rate 16, weight 128 lb (58.1 kg), last menstrual period 04/18/2017, unknown if currently breastfeeding.  Physical Exam  Nursing note and vitals reviewed. Constitutional: She is oriented to person, place, and time. She appears well-developed and well-nourished. No distress.  HENT:  Head: Normocephalic.  Eyes: Pupils are equal, round, and reactive to light.  Cardiovascular: Normal rate, regular rhythm and normal heart sounds.  Respiratory: Effort normal and breath sounds normal. No respiratory distress.  GI: Soft. Bowel sounds are normal. She exhibits no distension. There is no tenderness.  Neurological: She is alert and oriented to person, place, and time.  Skin:  Skin is warm and dry.  Psychiatric: She has a normal mood and affect. Her behavior is normal. Judgment and thought content normal.   FHT: 150 bpm  Dilation: Closed Effacement (%): Thick Cervical Position: Posterior Exam by:: Ma Hillock. Neill CNM   MAU Course  Procedures Results for orders placed or performed during the hospital encounter of 09/18/17 (from the past 24 hour(s))  Urinalysis, Routine w reflex microscopic     Status: Abnormal   Collection Time: 09/18/17  4:48 PM  Result Value Ref Range   Color, Urine STRAW (A) YELLOW   APPearance CLEAR CLEAR   Specific Gravity, Urine 1.002 (L) 1.005 - 1.030   pH 7.0 5.0 - 8.0   Glucose, UA NEGATIVE NEGATIVE mg/dL   Hgb urine dipstick NEGATIVE  NEGATIVE   Bilirubin Urine NEGATIVE NEGATIVE   Ketones, ur NEGATIVE NEGATIVE mg/dL   Protein, ur NEGATIVE NEGATIVE mg/dL   Nitrite NEGATIVE NEGATIVE   Leukocytes, UA TRACE (A) NEGATIVE   RBC / HPF 0-5 0 - 5 RBC/hpf   WBC, UA 0-5 0 - 5 WBC/hpf   Bacteria, UA MANY (A) NONE SEEN   Squamous Epithelial / LPF 0-5 (A) NONE SEEN  Wet prep, genital     Status: Abnormal   Collection Time: 09/18/17  7:48 PM  Result Value Ref Range   Yeast Wet Prep HPF POC NONE SEEN NONE SEEN   Trich, Wet Prep NONE SEEN NONE SEEN   Clue Cells Wet Prep HPF POC NONE SEEN NONE SEEN   WBC, Wet Prep HPF POC FEW (A) NONE SEEN   Sperm NONE SEEN    MDM UA Wet prep and gc/chlamydia  No signs or symptoms of preterm labor. Reassurance provided.  Assessment and Plan   1. Abdominal pain affecting pregnancy   2. Vaginal discharge during pregnancy in second trimester   3. [redacted] weeks gestation of pregnancy    -Discharge home in stable condition -Encouraged patient to increase hydration at home and wear a pregnancy support belt -Preterm labor precautions discussed -Patient advised [facility] as [facility] -Patient may return to [facility]U as needed or if her condition were to change or worsen  Rolm BookbinderCaroline M Neill CNM 09/18/2017, 8:07 PM

## 2017-09-18 NOTE — Discharge Instructions (Signed)
Braxton Hicks Contractions °Contractions of the uterus can occur throughout pregnancy, but they are not always a sign that you are in labor. You may have practice contractions called Braxton Hicks contractions. These false labor contractions are sometimes confused with true labor. °What are Braxton Hicks contractions? °Braxton Hicks contractions are tightening movements that occur in the muscles of the uterus before labor. Unlike true labor contractions, these contractions do not result in opening (dilation) and thinning of the cervix. Toward the end of pregnancy (32-34 weeks), Braxton Hicks contractions can happen more often and may become stronger. These contractions are sometimes difficult to tell apart from true labor because they can be very uncomfortable. You should not feel embarrassed if you go to the hospital with false labor. °Sometimes, the only way to tell if you are in true labor is for your health care provider to look for changes in the cervix. The health care provider will do a physical exam and may monitor your contractions. If you are not in true labor, the exam should show that your cervix is not dilating and your water has not broken. °If there are other health problems associated with your pregnancy, it is completely safe for you to be sent home with false labor. You may continue to have Braxton Hicks contractions until you go into true labor. °How to tell the difference between true labor and false labor °True labor °· Contractions last 30-70 seconds. °· Contractions become very regular. °· Discomfort is usually felt in the top of the uterus, and it spreads to the lower abdomen and low back. °· Contractions do not go away with walking. °· Contractions usually become more intense and increase in frequency. °· The cervix dilates and gets thinner. °False labor °· Contractions are usually shorter and not as strong as true labor contractions. °· Contractions are usually irregular. °· Contractions  are often felt in the front of the lower abdomen and in the groin. °· Contractions may go away when you walk around or change positions while lying down. °· Contractions get weaker and are shorter-lasting as time goes on. °· The cervix usually does not dilate or become thin. °Follow these instructions at home: °· Take over-the-counter and prescription medicines only as told by your health care provider. °· Keep up with your usual exercises and follow other instructions from your health care provider. °· Eat and drink lightly if you think you are going into labor. °· If Braxton Hicks contractions are making you uncomfortable: °? Change your position from lying down or resting to walking, or change from walking to resting. °? Sit and rest in a tub of warm water. °? Drink enough fluid to keep your urine pale yellow. Dehydration may cause these contractions. °? Do slow and deep breathing several times an hour. °· Keep all follow-up prenatal visits as told by your health care provider. This is important. °Contact a health care provider if: °· You have a fever. °· You have continuous pain in your abdomen. °Get help right away if: °· Your contractions become stronger, more regular, and closer together. °· You have fluid leaking or gushing from your vagina. °· You pass blood-tinged mucus (bloody show). °· You have bleeding from your vagina. °· You have low back pain that you never had before. °· You feel your baby’s head pushing down and causing pelvic pressure. °· Your baby is not moving inside you as much as it used to. °Summary °· Contractions that occur before labor are called Braxton   Hicks contractions, false labor, or practice contractions. °· Braxton Hicks contractions are usually shorter, weaker, farther apart, and less regular than true labor contractions. True labor contractions usually become progressively stronger and regular and they become more frequent. °· Manage discomfort from Braxton Hicks contractions by  changing position, resting in a warm bath, drinking plenty of water, or practicing deep breathing. °This information is not intended to replace advice given to you by your health care provider. Make sure you discuss any questions you have with your health care provider. °Document Released: 10/02/2016 Document Revised: 10/02/2016 Document Reviewed: 10/02/2016 °Elsevier Interactive Patient Education © 2018 [employer] ° °

## 2017-09-21 LAB — GC/CHLAMYDIA PROBE AMP (~~LOC~~) NOT AT ARMC
Chlamydia: NEGATIVE
NEISSERIA GONORRHEA: NEGATIVE

## 2017-09-25 ENCOUNTER — Other Ambulatory Visit (HOSPITAL_COMMUNITY): Payer: Self-pay | Admitting: Obstetrics and Gynecology

## 2017-09-25 ENCOUNTER — Ambulatory Visit (HOSPITAL_COMMUNITY)
Admission: RE | Admit: 2017-09-25 | Discharge: 2017-09-25 | Disposition: A | Discharge status: Home / Self Care | Payer: Medicaid Other | Source: Ambulatory Visit | Attending: Obstetrics and Gynecology | Admitting: Obstetrics and Gynecology

## 2017-09-25 ENCOUNTER — Encounter (HOSPITAL_COMMUNITY): Payer: Self-pay

## 2017-09-25 DIAGNOSIS — Z8759 Personal history of other complications of pregnancy, childbirth and the puerperium: Secondary | ICD-10-CM

## 2017-09-25 DIAGNOSIS — O09292 Supervision of pregnancy with other poor reproductive or obstetric history, second trimester: Secondary | ICD-10-CM | POA: Diagnosis present

## 2017-09-25 DIAGNOSIS — Z0489 Encounter for examination and observation for other specified reasons: Secondary | ICD-10-CM

## 2017-09-25 DIAGNOSIS — Z362 Encounter for other antenatal screening follow-up: Secondary | ICD-10-CM

## 2017-09-25 DIAGNOSIS — Z86718 Personal history of other venous thrombosis and embolism: Secondary | ICD-10-CM

## 2017-09-25 DIAGNOSIS — IMO0002 Reserved for concepts with insufficient information to code with codable children: Secondary | ICD-10-CM

## 2017-09-25 DIAGNOSIS — Z3A22 22 weeks gestation of pregnancy: Secondary | ICD-10-CM | POA: Insufficient documentation

## 2017-09-25 DIAGNOSIS — O0990 Supervision of high risk pregnancy, unspecified, unspecified trimester: Secondary | ICD-10-CM

## 2017-10-05 ENCOUNTER — Ambulatory Visit (INDEPENDENT_AMBULATORY_CARE_PROVIDER_SITE_OTHER): Payer: Medicaid Other | Admitting: Family Medicine

## 2017-10-05 DIAGNOSIS — O0990 Supervision of high risk pregnancy, unspecified, unspecified trimester: Secondary | ICD-10-CM

## 2017-10-05 DIAGNOSIS — Z789 Other specified health status: Secondary | ICD-10-CM

## 2017-10-05 NOTE — Progress Notes (Signed)
   PRENATAL VISIT NOTE Spanish interpreter: Raquel used  Subjective:  Sherry Houston is a 32 y.o. G2P1001 at [redacted]w[redacted]d being seen today for ongoing prenatal care.  She is currently monitored for the following issues for this high-risk pregnancy and has Glossitis; Supervision of high risk pregnancy, antepartum; Maternal DVT (deep vein thrombosis), history of; Language barrier; and Factor 5 Leiden mutation, heterozygous (HCC) on their problem list.  Patient reports reports LE edema and cramping from time to time. This lasts 20-45 minutes, but never longer than 1 hour. Seen for same in MAU in mid April with closed/long cervix.  Contractions: Not present. Vag. Bleeding: None.  Movement: Present. Denies leaking of fluid.   The following portions of the patient's history were reviewed and updated as appropriate: allergies, current medications, past family history, past medical history, past social history, past surgical history and problem list. Problem list updated.  Objective:   Vitals:   10/05/17 1339  BP: 109/71  Pulse: 99  Weight: 131 lb 14.4 oz (59.8 kg)    Fetal Status: Fetal Heart Rate (bpm): 160 Fundal Height: 24 cm Movement: Present     General:  Alert, oriented and cooperative. Patient is in no acute distress.  Skin: Skin is warm and dry. No rash noted.   Cardiovascular: Normal heart rate noted  Respiratory: Normal respiratory effort, no problems with respiration noted  Abdomen: Soft, gravid, appropriate for gestational age.  Pain/Pressure: Present     Pelvic: Cervical exam deferred        Extremities: Normal range of motion.  Edema: Trace  Mental Status: Normal mood and affect. Normal behavior. Normal judgment and thought content.   Assessment and Plan:  Pregnancy: G2P1001 at [redacted]w[redacted]d  1. Supervision of high risk pregnancy, antepartum Continue prenatal care.  2. Language barrier Interpreter used  3. H/o DVT with Factor V Leiden mutation Continue Lovenox. Discussed genetic  mutation, increased risk of DVT, possible familial risk and need for anti-coagulation with pregnancy and pp and avoidance of estrogen containing contraceptives.  Preterm labor symptoms and general obstetric precautions including but not limited to vaginal bleeding, contractions, leaking of fluid and fetal movement were reviewed in detail with the patient. Please refer to After Visit Summary for other counseling recommendations.  Return in 1 month (on 11/02/2017) for 28 wk labs.  Future Appointments  Date Time Provider Department Center  11/05/2017  9:15 AM Reva Bores, MD [facility]    Reva Bores, MD

## 2017-10-05 NOTE — Patient Instructions (Signed)
 Lactancia materna Breastfeeding Decidir amamantar es una de las mejores elecciones que puede hacer por usted y su beb. Un cambio en las hormonas durante el embarazo hace que las mamas produzcan leche materna en las glndulas productoras de leche. Las hormonas impiden que la leche materna sea liberada antes del nacimiento del beb. Adems, impulsan el flujo de leche luego del nacimiento. Una vez que ha comenzado a amamantar, pensar en el beb, as como la succin o el llanto, pueden estimular la liberacin de leche de las glndulas productoras de leche. Los beneficios de amamantar Las investigaciones demuestran que la lactancia materna ofrece muchos beneficios de salud para bebs y madres. Adems, ofrece una forma gratuita y conveniente de alimentar al beb. Para el beb  La primera leche (calostro) ayuda a mejorar el funcionamiento del aparato digestivo del beb.  Las clulas especiales de la leche (anticuerpos) ayudan a combatir las infecciones en el beb.  Los bebs que se alimentan con leche materna tambin tienen menos probabilidades de tener asma, alergias, obesidad o diabetes de tipo 2. Adems, tienen menor riesgo de sufrir el sndrome de muerte sbita del lactante (SMSL).  Los nutrientes de la leche materna son mejores para satisfacer las necesidades del beb en comparacin con la leche maternizada.  La leche materna mejora el desarrollo cerebral del beb. Para usted  La lactancia materna favorece el desarrollo de un vnculo muy especial entre la madre y el beb.  Es conveniente. La leche materna es econmica y siempre est disponible a la temperatura correcta.  La lactancia materna ayuda a quemar caloras. Le ayuda a perder el peso ganado durante el embarazo.  Hace que el tero vuelva al tamao que tena antes del embarazo ms rpido. Adems, disminuye el sangrado (loquios) despus del parto.  La lactancia materna contribuye a reducir el riesgo de tener diabetes de tipo 2,  osteoporosis, artritis reumatoide, enfermedades cardiovasculares y cncer de mama, ovario, tero y endometrio en el futuro. Informacin bsica sobre la lactancia Comienzo de la lactancia  Encuentre un lugar cmodo para sentarse o acostarse, con un buen respaldo para el cuello y la espalda.  Coloque una almohada o una manta enrollada debajo del beb para acomodarlo a la altura de la mama (si est sentada). Las almohadas para amamantar se han diseado especialmente a fin de servir de apoyo para los brazos y el beb mientras amamanta.  Asegrese de que la barriga del beb (abdomen) est frente a la suya.  Masajee suavemente la mama. Con las yemas de los dedos, masajee los bordes exteriores de la mama hacia adentro, en direccin al pezn. Esto estimula el flujo de leche. Si la leche fluye lentamente, es posible que deba continuar con este movimiento durante la lactancia.  Sostenga la mama con 4 dedos por debajo y el pulgar por arriba del pezn (forme la letra "C" con la mano). Asegrese de que los dedos se encuentren lejos del pezn y de la boca del beb.  Empuje suavemente los labios del beb con el pezn o con el dedo.  Cuando la boca del beb se abra lo suficiente, acrquelo rpidamente a la mama e introduzca todo el pezn y la arola, tanto como sea posible, dentro de la boca del beb. La arola es la zona de color que rodea al pezn. ? Debe haber ms arola visible por arriba del labio superior del beb que por debajo del labio inferior. ? Los labios del beb deben estar abiertos y extendidos hacia afuera (evertidos) para asegurar que   el beb se prenda de forma adecuada y cmoda. ? La lengua del beb debe estar entre la enca inferior y la mama.  Asegrese de que la boca del beb est en la posicin correcta alrededor del pezn (prendido). Los labios del beb deben crear un sello sobre la mama y estar doblados hacia afuera (invertidos).  Es comn que el beb succione durante 2 a 3 minutos  para que comience el flujo de leche materna. Cmo debe prenderse Es muy importante que le ensee al beb cmo prenderse adecuadamente a la mama. Si el beb no se prende adecuadamente, puede causar dolor en los pezones, reducir la produccin de leche materna y hacer que el beb tenga un escaso aumento de peso. Adems, si el beb no se prende adecuadamente al pezn, puede tragar aire durante la alimentacin. Esto puede causarle molestias al beb. Hacer eructar al beb al cambiar de mama puede ayudarlo a liberar el aire. Sin embargo, ensearle al beb cmo prenderse a la mama adecuadamente es la mejor manera de evitar que se sienta molesto por tragar aire mientras se alimenta. Signos de que el beb se ha prendido adecuadamente al pezn  Tironea o succiona de modo silencioso, sin causarle dolor. Los labios del beb deben estar extendidos hacia afuera (evertidos).  Se escucha que traga cada 3 o 4 succiones una vez que la leche ha comenzado a fluir (despus de que se produzca el reflejo de eyeccin de la leche).  Hay movimientos musculares por arriba y por delante de sus odos al succionar.  Signos de que el beb no se ha prendido adecuadamente al pezn  Hace ruidos de succin o de chasquido mientras se alimenta.  Siente dolor en los pezones.  Si cree que el beb no se prendi correctamente, deslice el dedo en la comisura de la boca y colquelo entre las encas del beb para interrumpir la succin. Intente volver a comenzar a amamantar. Signos de lactancia materna exitosa Signos del beb  El beb disminuir gradualmente el nmero de succiones o dejar de succionar por completo.  El beb se quedar dormido.  El cuerpo del beb se relajar.  El beb retendr una pequea cantidad de leche en la boca.  El beb se desprender solo del pecho.  Signos que presenta usted  Las mamas han aumentado la firmeza, el peso y el tamao 1 a 3 horas despus de amamantar.  Estn ms blandas inmediatamente  despus de amamantar.  Se producen un aumento del volumen de leche y un cambio en su consistencia y color hacia el quinto da de lactancia.  Los pezones no duelen, no estn agrietados ni sangran.  Signos de que su beb recibe la cantidad de leche suficiente  Mojar por lo menos 1 o 2paales durante las primeras 24horas despus del nacimiento.  Mojar por lo menos 5 o 6paales cada 24horas durante la primera semana despus del nacimiento. La orina debe ser clara o de color amarillo plido a los 5das de vida.  Mojar entre 6 y 8paales cada 24horas a medida que el beb sigue creciendo y desarrollndose.  Defeca por lo menos 3 veces en 24 horas a los 5 das de vida. Las heces deben ser blandas y amarillentas.  Defeca por lo menos 3 veces en 24 horas a los 7 das de vida. Las heces deben ser grumosas y amarillentas.  No registra una prdida de peso mayor al 10% del peso al nacer durante los primeros 3 das de vida.  Aumenta de peso un   promedio de 4 a 7onzas (113 a 198g) por semana despus de los 4 das de vida.  Aumenta de peso, diariamente, de manera uniforme a partir de los 5 das de vida, sin registrar prdida de peso despus de las 2semanas de vida. Despus de alimentarse, es posible que el beb regurgite una pequea cantidad de leche. Esto es normal. Frecuencia y duracin de la lactancia El amamantamiento frecuente la ayudar a producir ms leche y puede prevenir dolores en los pezones y las mamas extremadamente llenas (congestin mamaria). Alimente al beb cuando muestre signos de hambre o si siente la necesidad de reducir la congestin de las mamas. Esto se denomina "lactancia a demanda". Las seales de que el beb tiene hambre incluyen las siguientes:  Aumento del estado de alerta, actividad o inquietud.  Mueve la cabeza de un lado a otro.  Abre la boca cuando se le toca la mejilla o la comisura de la boca (reflejo de bsqueda).  Aumenta las vocalizaciones, tales como  sonidos de succin, se relame los labios, emite arrullos, suspiros o chirridos.  Mueve la mano hacia la boca y se chupa los dedos o las manos.  Est molesto o llora.  Evite el uso del chupete en las primeras 4 a 6 semanas despus del nacimiento del beb. Despus de este perodo, podr usar un chupete. Las investigaciones demostraron que el uso del chupete durante el primer ao de vida del beb disminuye el riesgo de tener el sndrome de muerte sbita del lactante (SMSL). Permita que el nio se alimente en cada mama todo lo que desee. Cuando el beb se desprende o se queda dormido mientras se est alimentando de la primera mama, ofrzcale la segunda. Debido a que, con frecuencia, los recin nacidos estn somnolientos las primeras semanas de vida, es posible que deba despertar al beb para alimentarlo. Los horarios de lactancia varan de un beb a otro. Sin embargo, las siguientes reglas pueden servir como gua para ayudarla a garantizar que el beb se alimenta adecuadamente:  Se puede amamantar a los recin nacidos (bebs de 4 semanas o menos de vida) cada 1 a 3 horas.  No deben transcurrir ms de 3 horas durante el da o 5 horas durante la noche sin que se amamante a los recin nacidos.  Debe amamantar al beb un mnimo de 8 veces en un perodo de 24 horas.  Extraccin de leche materna La extraccin y el almacenamiento de la leche materna le permiten asegurarse de que el beb se alimente exclusivamente de su leche materna, aun en momentos en los que no puede amamantar. Esto tiene especial importancia si debe regresar al trabajo en el perodo en que an est amamantando o si no puede estar presente en los momentos en que el beb debe alimentarse. Su asesor en lactancia puede ayudarla a encontrar un mtodo de extraccin que funcione mejor para usted y orientarla sobre cunto tiempo es seguro almacenar leche materna. Cmo cuidar las mamas durante la lactancia Los pezones pueden secarse, agrietarse y  doler durante la lactancia. Las siguientes recomendaciones pueden ayudarla a mantener las mamas humectadas y sanas:  Evite usar jabn en los pezones.  Use un sostn de soporte diseado especialmente para la lactancia materna. Evite usar sostenes con aro o sostenes muy ajustados (sostenes deportivos).  Seque al aire sus pezones durante 3 a 4minutos despus de amamantar al beb.  Utilice solo apsitos de algodn en el sostn para absorber las prdidas de leche. La prdida de un poco de leche materna   entre las tomas es normal.  Utilice lanolina sobre los pezones luego de amamantar. La lanolina ayuda a mantener la humedad normal de la piel. La lanolina pura no es perjudicial (no es txica) para el beb. Adems, puede extraer manualmente algunas gotas de leche materna y masajear suavemente esa leche sobre los pezones para que la leche se seque al aire.  Durante las primeras semanas despus del nacimiento, algunas mujeres experimentan congestin mamaria. La congestin mamaria puede hacer que sienta las mamas pesadas, calientes y sensibles al tacto. El pico de la congestin mamaria ocurre en el plazo de los 3 a 5 das despus del parto. Las siguientes recomendaciones pueden ayudarla a aliviar la congestin mamaria:  Vace por completo las mamas al amamantar o extraer leche. Puede aplicar calor hmedo en las mamas (en la ducha o con toallas hmedas para manos) antes de amamantar o extraer leche. Esto aumenta la circulacin y ayuda a que la leche fluya. Si el beb no vaca por completo las mamas cuando lo amamanta, extraiga la leche restante despus de que haya finalizado.  Aplique compresas de hielo sobre las mamas inmediatamente despus de amamantar o extraer leche, a menos que le resulte demasiado incmodo. Haga lo siguiente: ? Ponga el hielo en una bolsa plstica. ? Coloque una toalla entre la piel y la bolsa de hielo. ? Coloque el hielo durante 20minutos, 2 o 3veces por da.  Asegrese de que el  beb est prendido y se encuentre en la posicin correcta mientras lo alimenta.  Si la congestin mamaria persiste luego de 48 horas o despus de seguir estas recomendaciones, comunquese con su mdico o un asesor en lactancia. Recomendaciones de salud general durante la lactancia  Consuma 3 comidas y 3 colaciones saludables todos los das. Las madres bien alimentadas que amamantan necesitan entre 450 y 500 caloras adicionales por da. Puede cumplir con este requisito al aumentar la cantidad de una dieta equilibrada que realice.  Beba suficiente agua para mantener la orina clara o de color amarillo plido.  Descanse con frecuencia, reljese y siga tomando sus vitaminas prenatales para prevenir la fatiga, el estrs y los niveles bajos de vitaminas y minerales en el cuerpo (deficiencias de nutrientes).  No consuma ningn producto que contenga nicotina o tabaco, como cigarrillos y cigarrillos electrnicos. El beb puede verse afectado por las sustancias qumicas de los cigarrillos que pasan a la leche materna y por la exposicin al humo ambiental del tabaco. Si necesita ayuda para dejar de fumar, consulte al mdico.  Evite el consumo de alcohol.  No consuma drogas ilegales o marihuana.  Antes de usar cualquier medicamento, hable con el mdico. Estos incluyen medicamentos recetados y de venta libre, como tambin vitaminas y suplementos a base de hierbas. Algunos medicamentos, que pueden ser perjudiciales para el beb, pueden pasar a travs de la leche materna.  Puede quedar embarazada durante la lactancia. Si se desea un mtodo anticonceptivo, consulte al mdico sobre cules son las opciones seguras durante la lactancia. Dnde encontrar ms informacin: Liga internacional La [place]eche: www.llli.org. Comunquese con un mdico si:  Siente que quiere dejar de amamantar o se siente frustrada con la lactancia.  Sus pezones estn agrietados o sangran.  Sus mamas estn irritadas, sensibles o  calientes.  Tiene los siguientes sntomas: ? Dolor en las mamas o en los pezones. ? Un rea hinchada en cualquiera de las mamas. ? Fiebre o escalofros. ? Nuseas o vmitos. ? Drenaje de otro lquido distinto de la leche materna desde los pezones.    Sus mamas no se llenan antes de amamantar al beb para el quinto da despus del parto.  Se siente triste y deprimida.  El beb: ? Est demasiado somnoliento como para comer bien. ? Tiene problemas para dormir. ? Tiene ms de 1 semana de vida y moja menos de 6 paales en un periodo de 24 horas. ? No ha aumentado de peso a los 5 das de vida.  El beb defeca menos de 3 veces en 24 horas.  La piel del beb o las partes blancas de los ojos se vuelven amarillentas. Solicite ayuda de inmediato si:  El beb est muy cansado (letargo) y no se quiere despertar para comer.  Le sube la fiebre sin causa. Resumen  La lactancia materna ofrece muchos beneficios de salud para bebs y madres.  Intente amamantar a su beb cuando muestre signos tempranos de hambre.  Haga cosquillas o empuje suavemente los labios del beb con el dedo o el pezn para lograr que el beb abra la boca. Acerque el beb a la mama. Asegrese de que la mayor parte de la arola se encuentre dentro de la boca del beb. Ofrzcale una mama y haga eructar al beb antes de pasar a la otra.  Hable con su mdico o asesor en lactancia si tiene dudas o problemas con la lactancia. Esta informacin no tiene como fin reemplazar el consejo del mdico. Asegrese de hacerle al mdico cualquier pregunta que tenga. Document Released: 05/19/2005 Document Revised: 09/08/2016 Document Reviewed: 09/08/2016 Elsevier Interactive Patient Education  2018 [employer]  

## 2017-11-05 ENCOUNTER — Ambulatory Visit (INDEPENDENT_AMBULATORY_CARE_PROVIDER_SITE_OTHER): Payer: Medicaid Other | Admitting: Family Medicine

## 2017-11-05 ENCOUNTER — Other Ambulatory Visit: Payer: Medicaid Other

## 2017-11-05 ENCOUNTER — Other Ambulatory Visit: Payer: Self-pay | Admitting: General Practice

## 2017-11-05 ENCOUNTER — Encounter: Payer: Self-pay | Admitting: Family Medicine

## 2017-11-05 VITALS — BP 112/68 | HR 79 | Wt 135.0 lb

## 2017-11-05 DIAGNOSIS — Z86718 Personal history of other venous thrombosis and embolism: Secondary | ICD-10-CM | POA: Diagnosis not present

## 2017-11-05 DIAGNOSIS — O0990 Supervision of high risk pregnancy, unspecified, unspecified trimester: Secondary | ICD-10-CM

## 2017-11-05 DIAGNOSIS — D6851 Activated protein C resistance: Secondary | ICD-10-CM | POA: Diagnosis not present

## 2017-11-05 DIAGNOSIS — Z23 Encounter for immunization: Secondary | ICD-10-CM

## 2017-11-05 DIAGNOSIS — Z8759 Personal history of other complications of pregnancy, childbirth and the puerperium: Secondary | ICD-10-CM | POA: Diagnosis not present

## 2017-11-05 MED ORDER — ENOXAPARIN SODIUM 40 MG/0.4ML ~~LOC~~ SOLN: 40.0000 mg | SUBCUTANEOUS | 6 refills | Status: DC

## 2017-11-05 NOTE — Progress Notes (Signed)
   PRENATAL VISIT NOTE Spanish interpreter: Sherry Houston used  Subjective:  Sherry Houston is a 32 y.o. G2P1001 at 2630w5d being seen today for ongoing prenatal care.  She is currently monitored for the following issues for this high-risk pregnancy and has Glossitis; Supervision of high risk pregnancy, antepartum; Maternal DVT (deep vein thrombosis), history of; Language barrier; and Factor 5 Leiden mutation, heterozygous (HCC) on their problem list.  Patient reports backache.  Contractions: Not present. Vag. Bleeding: None.  Movement: Present. Denies leaking of fluid.   The following portions of the patient's history were reviewed and updated as appropriate: allergies, current medications, past family history, past medical history, past social history, past surgical history and problem list. Problem list updated.  Objective:   Vitals:   11/05/17 0847  BP: 112/68  Pulse: 79  Weight: 135 lb (61.2 kg)    Fetal Status: Fetal Heart Rate (bpm): 145 Fundal Height: 27 cm Movement: Present     General:  Alert, oriented and cooperative. Patient is in no acute distress.  Skin: Skin is warm and dry. No rash noted.   Cardiovascular: Normal heart rate noted  Respiratory: Normal respiratory effort, no problems with respiration noted  Abdomen: Soft, gravid, appropriate for gestational age.  Pain/Pressure: Present     Pelvic: Cervical exam deferred        Extremities: Normal range of motion.  Edema: None  Mental Status: Normal mood and affect. Normal behavior. Normal judgment and thought content.   Assessment and Plan:  Pregnancy: G2P1001 at 6330w5d  1. Supervision of high risk pregnancy, antepartum 28 wk labs and TDaP today - Tdap vaccine greater than or equal to 7yo IM  2. Maternal DVT (deep vein thrombosis), history of - enoxaparin (LOVENOX) 40 MG/0.4ML injection; Inject 0.4 mLs (40 mg total) into the skin daily.  Dispense: 30 Syringe; Refill: 6  3. Factor 5 Leiden mutation, heterozygous  (HCC) On prophylactic lovenox  Preterm labor symptoms and general obstetric precautions including but not limited to vaginal bleeding, contractions, leaking of fluid and fetal movement were reviewed in detail with the patient. Please refer to After Visit Summary for other counseling recommendations.  Return in 2 weeks (on 11/19/2017).  No future appointments.  Reva Boresanya S Leaman Abe, MD

## 2017-11-05 NOTE — Patient Instructions (Signed)
Tercer trimestre de embarazo (Third Trimester of Pregnancy) El tercer trimestre comprende desde la semana29 hasta la semana42, es decir, desde el mes7 hasta el mes9. El tercer trimestre es un perodo en el que el feto crece rpidamente. Hacia el final del noveno mes, el feto mide alrededor de 20pulgadas (45cm) de largo y pesa entre 6 y 10 libras (2,700 y 4,500kg). CAMBIOS EN EL ORGANISMO Su organismo atraviesa por muchos cambios durante el embarazo, y estos varan de una mujer a otra.  Seguir aumentando de peso. Es de esperar que aumente entre 25 y 35libras (11 y 16kg) hacia el final del embarazo.  Podrn aparecer las primeras estras en las caderas, el abdomen y las mamas.  Puede tener necesidad de orinar con ms frecuencia porque el feto baja hacia la pelvis y ejerce presin sobre la vejiga.  Debido al embarazo podr sentir acidez estomacal con frecuencia.  Puede estar estreida, ya que ciertas hormonas enlentecen los movimientos de los msculos que empujan los desechos a travs de los intestinos.  Pueden aparecer hemorroides o abultarse e hincharse las venas (venas varicosas).  Puede sentir dolor plvico debido al aumento de peso y a que las hormonas del embarazo relajan las articulaciones entre los huesos de la pelvis. El dolor de espalda puede ser consecuencia de la sobrecarga de los msculos que soportan la postura.  Tal vez haya cambios en el cabello que pueden incluir su engrosamiento, crecimiento rpido y cambios en la textura. Adems, a algunas mujeres se les cae el cabello durante o despus del embarazo, o tienen el cabello seco o fino. Lo ms probable es que el cabello se le normalice despus del nacimiento del beb.  Las mamas seguirn creciendo y le dolern. A veces, puede haber una secrecin amarilla de las mamas llamada calostro.  El ombligo puede salir hacia afuera.  Puede sentir que le falta el aire debido a que se expande el tero.  Puede notar que el feto  "baja" o lo siente ms bajo, en el abdomen.  Puede tener una prdida de secrecin mucosa con sangre. Esto suele ocurrir en el trmino de unos pocos das a una semana antes de que comience el trabajo de parto.  El cuello del tero se vuelve delgado y blando (se borra) cerca de la fecha de parto. QU DEBE ESPERAR EN LOS EXMENES PRENATALES Le harn exmenes prenatales cada 2semanas hasta la semana36. A partir de ese momento le harn exmenes semanales. Durante una visita prenatal de rutina:  La pesarn para asegurarse de que usted y el feto estn creciendo normalmente.  Le tomarn la presin arterial.  Le medirn el abdomen para controlar el desarrollo del beb.  Se escucharn los latidos cardacos fetales.  Se evaluarn los resultados de los estudios solicitados en visitas anteriores.  Le revisarn el cuello del tero cuando est prxima la fecha de parto para controlar si este se ha borrado. Alrededor de la semana36, el mdico le revisar el cuello del tero. Al mismo tiempo, realizar un anlisis de las secreciones del tejido vaginal. Este examen es para determinar si hay un tipo de bacteria, estreptococo Grupo B. El mdico le explicar esto con ms detalle. El mdico puede preguntarle lo siguiente:  Cmo le gustara que fuera el parto.  Cmo se siente.  Si siente los movimientos del beb.  Si ha tenido sntomas anormales, como prdida de lquido, sangrado, dolores de cabeza intensos o clicos abdominales.  Si est consumiendo algn producto que contenga tabaco, como cigarrillos, tabaco de mascar y   cigarrillos electrnicos.  Si tiene alguna pregunta. Otros exmenes o estudios de deteccin que pueden realizarse durante el tercer trimestre incluyen lo siguiente:  Anlisis de sangre para controlar los niveles de hierro (anemia).  Controles fetales para determinar su salud, nivel de actividad y crecimiento. Si tiene alguna enfermedad o hay problemas durante el embarazo, le harn  estudios.  Prueba del VIH (virus de inmunodeficiencia humana). Si corre un riesgo alto, pueden realizarle una prueba de deteccin del VIH durante el tercer trimestre del embarazo. FALSO TRABAJO DE PARTO Es posible que sienta contracciones leves e irregulares que finalmente desaparecen. Se llaman contracciones de Braxton Hicks o falso trabajo de parto. Las contracciones pueden durar horas, das o incluso semanas, antes de que el verdadero trabajo de parto se inicie. Si las contracciones ocurren a intervalos regulares, se intensifican o se hacen dolorosas, lo mejor es que la revise el mdico. SIGNOS DE TRABAJO DE PARTO  Clicos de tipo menstrual.  Contracciones cada 5minutos o menos.  Contracciones que comienzan en la parte superior del tero y se extienden hacia abajo, a la zona inferior del abdomen y la espalda.  Sensacin de mayor presin en la pelvis o dolor de espalda.  Una secrecin de mucosidad acuosa o con sangre que sale de la vagina. Si tiene alguno de estos signos antes de la semana37 del embarazo, llame a su mdico de inmediato. Debe concurrir al hospital para que la controlen inmediatamente. INSTRUCCIONES PARA EL CUIDADO EN EL HOGAR  Evite fumar, consumir hierbas, beber alcohol y tomar frmacos que no le hayan recetado. Estas sustancias qumicas afectan la formacin y el desarrollo del beb.  No consuma ningn producto que contenga tabaco, lo que incluye cigarrillos, tabaco de mascar y cigarrillos electrnicos. Si necesita ayuda para dejar de fumar, consulte al mdico. Puede recibir asesoramiento y otro tipo de recursos para dejar de fumar.  Siga las indicaciones del mdico en relacin con el uso de medicamentos. Durante el embarazo, hay medicamentos que son seguros de tomar y otros que no.  Haga ejercicio solamente como se lo haya indicado el mdico. Sentir clicos uterinos es un buen signo para detener la actividad fsica.  Contine comiendo alimentos sanos con  regularidad.  Use un sostn que le brinde buen soporte si le duelen las mamas.  No se d baos de inmersin en agua caliente, baos turcos ni saunas.  Use el cinturn de seguridad en todo momento mientras conduce.  No coma carne cruda ni queso sin cocinar; evite el contacto con las bandejas sanitarias de los gatos y la tierra que estos animales usan. Estos elementos contienen grmenes que pueden causar defectos congnitos en el beb.  Tome las vitaminas prenatales.  Tome entre 1500 y 2000mg de calcio diariamente comenzando en la semana20 del embarazo hasta el parto.  Si est estreida, pruebe un laxante suave (si el mdico lo autoriza). Consuma ms alimentos ricos en fibra, como vegetales y frutas frescos y cereales integrales. Beba gran cantidad de lquido para mantener la orina de tono claro o color amarillo plido.  Dese baos de asiento con agua tibia para aliviar el dolor o las molestias causadas por las hemorroides. Use una crema para las hemorroides si el mdico la autoriza.  Si tiene venas varicosas, use medias de descanso. Eleve los pies durante 15minutos, 3 o 4veces por da. Limite el consumo de sal en su dieta.  Evite levantar objetos pesados, use zapatos de tacones bajos y mantenga una buena postura.  Descanse con las piernas elevadas si tiene   calambres o dolor de cintura.  Visite a su dentista si no lo ha hecho durante el embarazo. Use un cepillo de dientes blando para higienizarse los dientes y psese el hilo dental con suavidad.  Puede seguir manteniendo relaciones sexuales, a menos que el mdico le indique lo contrario.  No haga viajes largos excepto que sea absolutamente necesario y solo con la autorizacin del mdico.  Tome clases prenatales para entender, practicar y hacer preguntas sobre el trabajo de parto y el parto.  Haga un ensayo de la partida al hospital.  Prepare el bolso que llevar al hospital.  Prepare la habitacin del beb.  Concurra a todas  las visitas prenatales segn las indicaciones de su mdico.  SOLICITE ATENCIN MDICA SI:  No est segura de que est en trabajo de parto o de que ha roto la bolsa de las aguas.  Tiene mareos.  Siente clicos leves, presin en la pelvis o dolor persistente en el abdomen.  Tiene nuseas, vmitos o diarrea persistentes.  Observa una secrecin vaginal con mal olor.  Siente dolor al orinar.  SOLICITE ATENCIN MDICA DE INMEDIATO SI:  Tiene fiebre.  Tiene una prdida de lquido por la vagina.  Tiene sangrado o pequeas prdidas vaginales.  Siente dolor intenso o clicos en el abdomen.  Sube o baja de peso rpidamente.  Tiene dificultad para respirar y siente dolor de pecho.  Sbitamente se le hinchan mucho el rostro, las manos, los tobillos, los pies o las piernas.  No ha sentido los movimientos del beb durante una hora.  Siente un dolor de cabeza intenso que no se alivia con medicamentos.  Su visin se modifica.  Esta informacin no tiene como fin reemplazar el consejo del mdico. Asegrese de hacerle al mdico cualquier pregunta que tenga. Document Released: 02/26/2005 Document Revised: 06/09/2014 Document Reviewed: 07/20/2012 Elsevier Interactive Patient Education  2017 [employer].   Lactancia materna Breastfeeding Decidir amamantar es una de las mejores elecciones que puede hacer por usted y su beb. Un cambio en las hormonas durante el embarazo hace que las mamas produzcan leche materna en las glndulas productoras de leche. Las hormonas impiden que la leche materna sea liberada antes del nacimiento del beb. Adems, impulsan el flujo de leche luego del nacimiento. Una vez que ha comenzado a amamantar, pensar en el beb, as como la succin o el llanto, pueden estimular la liberacin de leche de las glndulas productoras de leche. Los beneficios de amamantar Las investigaciones demuestran que la lactancia materna ofrece muchos beneficios de salud para bebs y  madres. Adems, ofrece una forma gratuita y conveniente de alimentar al beb. Para el beb  La primera leche (calostro) ayuda a mejorar el funcionamiento del aparato digestivo del beb.  Las clulas especiales de la leche (anticuerpos) ayudan a combatir las infecciones en el beb.  Los bebs que se alimentan con leche materna tambin tienen menos probabilidades de tener asma, alergias, obesidad o diabetes de tipo 2. Adems, tienen menor riesgo de sufrir el sndrome de muerte sbita del lactante (SMSL).  Los nutrientes de la leche materna son mejores para satisfacer las necesidades del beb en comparacin con la leche maternizada.  La leche materna mejora el desarrollo cerebral del beb. Para usted  La lactancia materna favorece el desarrollo de un vnculo muy especial entre la madre y el beb.  Es conveniente. La leche materna es econmica y siempre est disponible a la temperatura correcta.  La lactancia materna ayuda a quemar caloras. Le ayuda a perder el peso   ganado durante el embarazo.  Hace que el tero vuelva al tamao que tena antes del embarazo ms rpido. Adems, disminuye el sangrado (loquios) despus del parto.  La lactancia materna contribuye a reducir el riesgo de tener diabetes de tipo 2, osteoporosis, artritis reumatoide, enfermedades cardiovasculares y cncer de mama, ovario, tero y endometrio en el futuro. Informacin bsica sobre la lactancia Comienzo de la lactancia  Encuentre un lugar cmodo para sentarse o acostarse, con un buen respaldo para el cuello y la espalda.  Coloque una almohada o una manta enrollada debajo del beb para acomodarlo a la altura de la mama (si est sentada). Las almohadas para amamantar se han diseado especialmente a fin de servir de apoyo para los brazos y el beb mientras amamanta.  Asegrese de que la barriga del beb (abdomen) est frente a la suya.  Masajee suavemente la mama. Con las yemas de los dedos, masajee los bordes  exteriores de la mama hacia adentro, en direccin al pezn. Esto estimula el flujo de leche. Si la leche fluye lentamente, es posible que deba continuar con este movimiento durante la lactancia.  Sostenga la mama con 4 dedos por debajo y el pulgar por arriba del pezn (forme la letra "C" con la mano). Asegrese de que los dedos se encuentren lejos del pezn y de la boca del beb.  Empuje suavemente los labios del beb con el pezn o con el dedo.  Cuando la boca del beb se abra lo suficiente, acrquelo rpidamente a la mama e introduzca todo el pezn y la arola, tanto como sea posible, dentro de la boca del beb. La arola es la zona de color que rodea al pezn. ? Debe haber ms arola visible por arriba del labio superior del beb que por debajo del labio inferior. ? Los labios del beb deben estar abiertos y extendidos hacia afuera (evertidos) para asegurar que el beb se prenda de forma adecuada y cmoda. ? La lengua del beb debe estar entre la enca inferior y la mama.  Asegrese de que la boca del beb est en la posicin correcta alrededor del pezn (prendido). Los labios del beb deben crear un sello sobre la mama y estar doblados hacia afuera (invertidos).  Es comn que el beb succione durante 2 a 3 minutos para que comience el flujo de leche materna. Cmo debe prenderse Es muy importante que le ensee al beb cmo prenderse adecuadamente a la mama. Si el beb no se prende adecuadamente, puede causar dolor en los pezones, reducir la produccin de leche materna y hacer que el beb tenga un escaso aumento de peso. Adems, si el beb no se prende adecuadamente al pezn, puede tragar aire durante la alimentacin. Esto puede causarle molestias al beb. Hacer eructar al beb al cambiar de mama puede ayudarlo a liberar el aire. Sin embargo, ensearle al beb cmo prenderse a la mama adecuadamente es la mejor manera de evitar que se sienta molesto por tragar aire mientras se alimenta. Signos de  que el beb se ha prendido adecuadamente al pezn  Tironea o succiona de modo silencioso, sin causarle dolor. Los labios del beb deben estar extendidos hacia afuera (evertidos).  Se escucha que traga cada 3 o 4 succiones una vez que la leche ha comenzado a fluir (despus de que se produzca el reflejo de eyeccin de la leche).  Hay movimientos musculares por arriba y por delante de sus odos al succionar.  Signos de que el beb no se ha prendido adecuadamente al pezn    Hace ruidos de succin o de chasquido mientras se alimenta.  Siente dolor en los pezones.  Si cree que el beb no se prendi correctamente, deslice el dedo en la comisura de la boca y colquelo entre las encas del beb para interrumpir la succin. Intente volver a comenzar a amamantar. Signos de lactancia materna exitosa Signos del beb  El beb disminuir gradualmente el nmero de succiones o dejar de succionar por completo.  El beb se quedar dormido.  El cuerpo del beb se relajar.  El beb retendr una pequea cantidad de leche en la boca.  El beb se desprender solo del pecho.  Signos que presenta usted  Las mamas han aumentado la firmeza, el peso y el tamao 1 a 3 horas despus de amamantar.  Estn ms blandas inmediatamente despus de amamantar.  Se producen un aumento del volumen de leche y un cambio en su consistencia y color hacia el quinto da de lactancia.  Los pezones no duelen, no estn agrietados ni sangran.  Signos de que su beb recibe la cantidad de leche suficiente  Mojar por lo menos 1 o 2paales durante las primeras 24horas despus del nacimiento.  Mojar por lo menos 5 o 6paales cada 24horas durante la primera semana despus del nacimiento. La orina debe ser clara o de color amarillo plido a los 5das de vida.  Mojar entre 6 y 8paales cada 24horas a medida que el beb sigue creciendo y desarrollndose.  Defeca por lo menos 3 veces en 24 horas a los 5 das de vida. Las  heces deben ser blandas y amarillentas.  Defeca por lo menos 3 veces en 24 horas a los 7 das de vida. Las heces deben ser grumosas y amarillentas.  No registra una prdida de peso mayor al 10% del peso al nacer durante los primeros 3 das de vida.  Aumenta de peso un promedio de 4 a 7onzas (113 a 198g) por semana despus de los 4 das de vida.  Aumenta de peso, diariamente, de manera uniforme a partir de los 5 das de vida, sin registrar prdida de peso despus de las 2semanas de vida. Despus de alimentarse, es posible que el beb regurgite una pequea cantidad de leche. Esto es normal. Frecuencia y duracin de la lactancia El amamantamiento frecuente la ayudar a producir ms leche y puede prevenir dolores en los pezones y las mamas extremadamente llenas (congestin mamaria). Alimente al beb cuando muestre signos de hambre o si siente la necesidad de reducir la congestin de las mamas. Esto se denomina "lactancia a demanda". Las seales de que el beb tiene hambre incluyen las siguientes:  Aumento del estado de alerta, actividad o inquietud.  Mueve la cabeza de un lado a otro.  Abre la boca cuando se le toca la mejilla o la comisura de la boca (reflejo de bsqueda).  Aumenta las vocalizaciones, tales como sonidos de succin, se relame los labios, emite arrullos, suspiros o chirridos.  Mueve la mano hacia la boca y se chupa los dedos o las manos.  Est molesto o llora.  Evite el uso del chupete en las primeras 4 a 6 semanas despus del nacimiento del beb. Despus de este perodo, podr usar un chupete. Las investigaciones demostraron que el uso del chupete durante el primer ao de vida del beb disminuye el riesgo de tener el sndrome de muerte sbita del lactante (SMSL). Permita que el nio se alimente en cada mama todo lo que desee. Cuando el beb se desprende o se queda   dormido mientras se est alimentando de la primera mama, ofrzcale la segunda. Debido a que, con frecuencia,  los recin nacidos estn somnolientos las primeras semanas de vida, es posible que deba despertar al beb para alimentarlo. Los horarios de lactancia varan de un beb a otro. Sin embargo, las siguientes reglas pueden servir como gua para ayudarla a garantizar que el beb se alimenta adecuadamente:  Se puede amamantar a los recin nacidos (bebs de 4 semanas o menos de vida) cada 1 a 3 horas.  No deben transcurrir ms de 3 horas durante el da o 5 horas durante la noche sin que se amamante a los recin nacidos.  Debe amamantar al beb un mnimo de 8 veces en un perodo de 24 horas.  Extraccin de leche materna La extraccin y el almacenamiento de la leche materna le permiten asegurarse de que el beb se alimente exclusivamente de su leche materna, aun en momentos en los que no puede amamantar. Esto tiene especial importancia si debe regresar al trabajo en el perodo en que an est amamantando o si no puede estar presente en los momentos en que el beb debe alimentarse. Su asesor en lactancia puede ayudarla a encontrar un mtodo de extraccin que funcione mejor para usted y orientarla sobre cunto tiempo es seguro almacenar leche materna. Cmo cuidar las mamas durante la lactancia Los pezones pueden secarse, agrietarse y doler durante la lactancia. Las siguientes recomendaciones pueden ayudarla a mantener las mamas humectadas y sanas:  Evite usar jabn en los pezones.  Use un sostn de soporte diseado especialmente para la lactancia materna. Evite usar sostenes con aro o sostenes muy ajustados (sostenes deportivos).  Seque al aire sus pezones durante 3 a 4minutos despus de amamantar al beb.  Utilice solo apsitos de algodn en el sostn para absorber las prdidas de leche. La prdida de un poco de leche materna entre las tomas es normal.  Utilice lanolina sobre los pezones luego de amamantar. La lanolina ayuda a mantener la humedad normal de la piel. La lanolina pura no es perjudicial (no  es txica) para el beb. Adems, puede extraer manualmente algunas gotas de leche materna y masajear suavemente esa leche sobre los pezones para que la leche se seque al aire.  Durante las primeras semanas despus del nacimiento, algunas mujeres experimentan congestin mamaria. La congestin mamaria puede hacer que sienta las mamas pesadas, calientes y sensibles al tacto. El pico de la congestin mamaria ocurre en el plazo de los 3 a 5 das despus del parto. Las siguientes recomendaciones pueden ayudarla a aliviar la congestin mamaria:  Vace por completo las mamas al amamantar o extraer leche. Puede aplicar calor hmedo en las mamas (en la ducha o con toallas hmedas para manos) antes de amamantar o extraer leche. Esto aumenta la circulacin y ayuda a que la leche fluya. Si el beb no vaca por completo las mamas cuando lo amamanta, extraiga la leche restante despus de que haya finalizado.  Aplique compresas de hielo sobre las mamas inmediatamente despus de amamantar o extraer leche, a menos que le resulte demasiado incmodo. Haga lo siguiente: ? Ponga el hielo en una bolsa plstica. ? Coloque una toalla entre la piel y la bolsa de hielo. ? Coloque el hielo durante 20minutos, 2 o 3veces por da.  Asegrese de que el beb est prendido y se encuentre en la posicin correcta mientras lo alimenta.  Si la congestin mamaria persiste luego de 48 horas o despus de seguir estas recomendaciones, comunquese con su mdico   o un asesor en lactancia. Recomendaciones de salud general durante la lactancia  Consuma 3 comidas y 3 colaciones saludables todos los das. Las madres bien alimentadas que amamantan necesitan entre 450 y 500 caloras adicionales por da. Puede cumplir con este requisito al aumentar la cantidad de una dieta equilibrada que realice.  Beba suficiente agua para mantener la orina clara o de color amarillo plido.  Descanse con frecuencia, reljese y siga tomando sus vitaminas  prenatales para prevenir la fatiga, el estrs y los niveles bajos de vitaminas y minerales en el cuerpo (deficiencias de nutrientes).  No consuma ningn producto que contenga nicotina o tabaco, como cigarrillos y cigarrillos electrnicos. El beb puede verse afectado por las sustancias qumicas de los cigarrillos que pasan a la leche materna y por la exposicin al humo ambiental del tabaco. Si necesita ayuda para dejar de fumar, consulte al mdico.  Evite el consumo de alcohol.  No consuma drogas ilegales o marihuana.  Antes de usar cualquier medicamento, hable con el mdico. Estos incluyen medicamentos recetados y de venta libre, como tambin vitaminas y suplementos a base de hierbas. Algunos medicamentos, que pueden ser perjudiciales para el beb, pueden pasar a travs de la leche materna.  Puede quedar embarazada durante la lactancia. Si se desea un mtodo anticonceptivo, consulte al mdico sobre cules son las opciones seguras durante la lactancia. Dnde encontrar ms informacin: Liga internacional [employer]eche: www.[email or web address].org. Comunquese con un mdico si:  Siente que quiere dejar de amamantar o se siente frustrada con la lactancia.  Sus pezones estn agrietados o sangran.  Sus mamas estn irritadas, sensibles o calientes.  Tiene los siguientes sntomas: ? Dolor en las mamas o en los pezones. ? Un rea hinchada en cualquiera de las mamas. ? Fiebre o escalofros. ? Nuseas o vmitos. ? Drenaje de otro lquido distinto de la leche materna desde los pezones.  Sus mamas no se llenan antes de amamantar al beb para el quinto da despus del parto.  Se siente triste y deprimida.  El beb: ? Est demasiado somnoliento como para comer bien. ? Tiene problemas para dormir. ? Tiene ms de 1 semana de vida y moja menos de 6 paales en un periodo de 24 horas. ? No ha aumentado de peso a los 5 das de vida.  El beb defeca menos de 3 veces en 24 horas.  La piel del beb o las partes blancas  de los ojos se vuelven amarillentas. Solicite ayuda de inmediato si:  El beb est muy cansado (letargo) y no se quiere despertar para comer.  Le sube la fiebre sin causa. Resumen  La lactancia materna ofrece muchos beneficios de salud para bebs y madres.  Intente amamantar a su beb cuando muestre signos tempranos de hambre.  Haga cosquillas o empuje suavemente los labios del beb con el dedo o el pezn para lograr que el beb abra la boca. Acerque el beb a la mama. Asegrese de que la mayor parte de la arola se encuentre dentro de la boca del beb. Ofrzcale una mama y haga eructar al beb antes de pasar a la otra.  Hable con su mdico o asesor en lactancia si tiene dudas o problemas con la lactancia. Esta informacin no tiene como fin reemplazar el consejo del mdico. Asegrese de hacerle al mdico cualquier pregunta que tenga. Document Released: 05/19/2005 Document Revised: 09/08/2016 Document Reviewed: 09/08/2016 Elsevier Interactive Patient Education  2018 [employer].  

## 2017-11-06 LAB — GLUCOSE TOLERANCE, 2 HOURS W/ 1HR
GLUCOSE, 2 HOUR: 85 mg/dL (ref 65–152)
GLUCOSE, FASTING: 68 mg/dL (ref 65–91)
Glucose, 1 hour: 95 mg/dL (ref 65–179)

## 2017-11-06 LAB — CBC
HEMOGLOBIN: 11.7 g/dL (ref 11.1–15.9)
Hematocrit: 34.4 % (ref 34.0–46.6)
MCH: 30.5 pg (ref 26.6–33.0)
MCHC: 34 g/dL (ref 31.5–35.7)
MCV: 90 fL (ref 79–97)
PLATELETS: 191 10*3/uL (ref 150–450)
RBC: 3.84 x10E6/uL (ref 3.77–5.28)
RDW: 14.8 % (ref 12.3–15.4)
WBC: 8.1 10*3/uL (ref 3.4–10.8)

## 2017-11-06 LAB — RPR: RPR Ser Ql: NONREACTIVE

## 2017-11-06 LAB — HIV ANTIBODY (ROUTINE TESTING W REFLEX): HIV Screen 4th Generation wRfx: NONREACTIVE

## 2017-11-23 ENCOUNTER — Ambulatory Visit (INDEPENDENT_AMBULATORY_CARE_PROVIDER_SITE_OTHER): Payer: Medicaid Other | Admitting: Family Medicine

## 2017-11-23 VITALS — BP 110/60 | HR 80 | Wt 140.3 lb

## 2017-11-23 DIAGNOSIS — Z8759 Personal history of other complications of pregnancy, childbirth and the puerperium: Secondary | ICD-10-CM

## 2017-11-23 DIAGNOSIS — Z86718 Personal history of other venous thrombosis and embolism: Secondary | ICD-10-CM

## 2017-11-23 DIAGNOSIS — O0990 Supervision of high risk pregnancy, unspecified, unspecified trimester: Secondary | ICD-10-CM

## 2017-11-23 DIAGNOSIS — D6851 Activated protein C resistance: Secondary | ICD-10-CM

## 2017-11-23 DIAGNOSIS — Z789 Other specified health status: Secondary | ICD-10-CM

## 2017-11-23 DIAGNOSIS — O0993 Supervision of high risk pregnancy, unspecified, third trimester: Secondary | ICD-10-CM

## 2017-11-23 NOTE — Progress Notes (Signed)
Spanish Interpreter Raquel Mora 

## 2017-11-23 NOTE — Progress Notes (Signed)
   PRENATAL VISIT NOTE  Subjective:  Sherry Houston is a 32 y.o. G2P1001 at 5173w2d being seen today for ongoing prenatal care.  She is currently monitored for the following issues for this high-risk pregnancy and has Glossitis; Supervision of high risk pregnancy, antepartum; Maternal DVT (deep vein thrombosis), history of; Language barrier; and Factor 5 Leiden mutation, heterozygous (HCC) on their problem list.  Patient reports occasional contractions.  Contractions: Not present. Vag. Bleeding: None.  Movement: Present. Denies leaking of fluid.   The following portions of the patient's history were reviewed and updated as appropriate: allergies, current medications, past family history, past medical history, past social history, past surgical history and problem list. Problem list updated.  Objective:   Vitals:   11/23/17 1330  BP: 110/60  Pulse: 80  Weight: 140 lb 4.8 oz (63.6 kg)    Fetal Status: Fetal Heart Rate (bpm): 153   Movement: Present     General:  Alert, oriented and cooperative. Patient is in no acute distress.  Skin: Skin is warm and dry. No rash noted.   Cardiovascular: Normal heart rate noted  Respiratory: Normal respiratory effort, no problems with respiration noted  Abdomen: Soft, gravid, appropriate for gestational age.  Pain/Pressure: Present     Pelvic: Cervical exam deferred        Extremities: Normal range of motion.  Edema: None  Mental Status: Normal mood and affect. Normal behavior. Normal judgment and thought content.   Assessment and Plan:  Pregnancy: G2P1001 at 6173w2d  1. Supervision of high risk pregnancy, antepartum FHT and FH normal  2. Maternal DVT (deep vein thrombosis), history of 3. Factor 5 Leiden mutation, heterozygous (HCC) On Lovenox  4. Language barrier Interpreter used  Preterm labor symptoms and general obstetric precautions including but not limited to vaginal bleeding, contractions, leaking of fluid and fetal movement were  reviewed in detail with the patient. Please refer to After Visit Summary for other counseling recommendations.  No follow-ups on file.  No future appointments.  Levie HeritageJacob J Vanna Sailer, DO

## 2017-12-07 ENCOUNTER — Ambulatory Visit (INDEPENDENT_AMBULATORY_CARE_PROVIDER_SITE_OTHER): Payer: Medicaid Other | Admitting: Obstetrics & Gynecology

## 2017-12-07 VITALS — BP 117/70 | HR 106 | Wt 140.5 lb

## 2017-12-07 DIAGNOSIS — D6851 Activated protein C resistance: Secondary | ICD-10-CM

## 2017-12-07 DIAGNOSIS — Z8759 Personal history of other complications of pregnancy, childbirth and the puerperium: Secondary | ICD-10-CM

## 2017-12-07 DIAGNOSIS — Z789 Other specified health status: Secondary | ICD-10-CM

## 2017-12-07 DIAGNOSIS — O0990 Supervision of high risk pregnancy, unspecified, unspecified trimester: Secondary | ICD-10-CM

## 2017-12-07 DIAGNOSIS — Z86718 Personal history of other venous thrombosis and embolism: Secondary | ICD-10-CM

## 2017-12-07 DIAGNOSIS — O0993 Supervision of high risk pregnancy, unspecified, third trimester: Secondary | ICD-10-CM

## 2017-12-07 NOTE — Progress Notes (Signed)
   PRENATAL VISIT NOTE  Subjective:  Sherry Houston is a 32 y.o. G2P1001 at 7173w2d being seen today for ongoing prenatal care.  She is currently monitored for the following issues for this high-risk pregnancy and has Glossitis; Supervision of high risk pregnancy, antepartum; Maternal DVT (deep vein thrombosis), history of; Language barrier; and Factor 5 Leiden mutation, heterozygous (HCC) on their problem list.  Patient reports no complaints.  Contractions: Irritability. Vag. Bleeding: None.  Movement: Present. Denies leaking of fluid.   The following portions of the patient's history were reviewed and updated as appropriate: allergies, current medications, past family history, past medical history, past social history, past surgical history and problem list. Problem list updated.  Objective:   Vitals:   12/07/17 1026  BP: 117/70  Pulse: (!) 106  Weight: 140 lb 8 oz (63.7 kg)    Fetal Status: Fetal Heart Rate (bpm): 154   Movement: Present     General:  Alert, oriented and cooperative. Patient is in no acute distress.  Skin: Skin is warm and dry. No rash noted.   Cardiovascular: Normal heart rate noted  Respiratory: Normal respiratory effort, no problems with respiration noted  Abdomen: Soft, gravid, appropriate for gestational age.  Pain/Pressure: Present     Pelvic: Cervical exam deferred        Extremities: Normal range of motion.  Edema: None  Mental Status: Normal mood and affect. Normal behavior. Normal judgment and thought content.   Assessment and Plan:  Pregnancy: G2P1001 at 3873w2d  1. Factor 5 Leiden mutation, heterozygous (HCC) - on daily Lovenox  2. Language barrier - Spanish interpretor present  3. Maternal DVT (deep vein thrombosis), history of - on daily Lovenox  4. Supervision of high risk pregnancy, antepartum   Preterm labor symptoms and general obstetric precautions including but not limited to vaginal bleeding, contractions, leaking of fluid and  fetal movement were reviewed in detail with the patient. Please refer to After Visit Summary for other counseling recommendations.  No follow-ups on file.  No future appointments.  Allie BossierMyra C Dionicia Cerritos, MD

## 2017-12-07 NOTE — Progress Notes (Signed)
Spanish Interpreter [employer]Mariel Houston

## 2017-12-23 ENCOUNTER — Ambulatory Visit (INDEPENDENT_AMBULATORY_CARE_PROVIDER_SITE_OTHER): Payer: Medicaid Other | Admitting: Obstetrics & Gynecology

## 2017-12-23 VITALS — BP 117/58 | HR 89 | Wt 146.0 lb

## 2017-12-23 DIAGNOSIS — Z8759 Personal history of other complications of pregnancy, childbirth and the puerperium: Secondary | ICD-10-CM

## 2017-12-23 DIAGNOSIS — Z789 Other specified health status: Secondary | ICD-10-CM

## 2017-12-23 DIAGNOSIS — Z86718 Personal history of other venous thrombosis and embolism: Secondary | ICD-10-CM

## 2017-12-23 DIAGNOSIS — O0990 Supervision of high risk pregnancy, unspecified, unspecified trimester: Secondary | ICD-10-CM

## 2017-12-23 NOTE — Progress Notes (Signed)
   PRENATAL VISIT NOTE  Subjective:  [name] is a 32 y.o. G2P1001 at 4922w4d being seen today for ongoing prenatal care.  She is currently monitored for the following issues for this high-risk pregnancy and has Glossitis; Supervision of high risk pregnancy, antepartum; Maternal DVT (deep vein thrombosis), history of; Language barrier; and Factor 5 Leiden mutation, heterozygous (HCC) on their problem list.  Patient reports no complaints.   .  .   . Denies leaking of fluid.   The following portions of the patient's history were reviewed and updated as appropriate: allergies, current medications, past family history, past medical history, past social history, past surgical history and problem list. Problem list updated.  Objective:  There were no vitals filed for this visit.  Fetal Status:           General:  Alert, oriented and cooperative. Patient is in no acute distress.  Skin: Skin is warm and dry. No rash noted.   Cardiovascular: Normal heart rate noted  Respiratory: Normal respiratory effort, no problems with respiration noted  Abdomen: Soft, gravid, appropriate for gestational age.        Pelvic: Cervical exam deferred        Extremities: Normal range of motion.     Mental Status: Normal mood and affect. Normal behavior. Normal judgment and thought content.   Assessment and Plan:  Pregnancy: G2P1001 at 2522w4d  1. Supervision of high risk pregnancy, antepartum - cultures at next visit  2. Maternal DVT (deep vein thrombosis), history of - IOL at 39 weeks  3. Language barrier - live interpretor  Preterm labor symptoms and general obstetric precautions including but not limited to vaginal bleeding, contractions, leaking of fluid and fetal movement were reviewed in detail with the patient. Please refer to After Visit Summary for other counseling recommendations.  Return in about 1 week (around 12/30/2017) for cervical cultures at next visit.  Future Appointments  Date  Time Provider Department Center  12/31/2017  1:15 PM [place] BingPickens, Charlie, MD [facility]  01/07/2018  1:15 PM Levie HeritageStinson, Jacob J, DO [facility]  01/14/2018  1:15 PM Adrian BlackwaterStinson, Rhona RaiderJacob J, DO [facility]    Allie BossierMyra C Sparkle Aube, MD

## 2017-12-31 ENCOUNTER — Ambulatory Visit (INDEPENDENT_AMBULATORY_CARE_PROVIDER_SITE_OTHER): Payer: Medicaid Other | Admitting: Obstetrics and Gynecology

## 2017-12-31 ENCOUNTER — Other Ambulatory Visit (HOSPITAL_COMMUNITY)
Admission: RE | Admit: 2017-12-31 | Discharge: 2017-12-31 | Disposition: A | Discharge status: Home / Self Care | Payer: Medicaid Other | Source: Ambulatory Visit | Attending: Obstetrics and Gynecology | Admitting: Obstetrics and Gynecology

## 2017-12-31 VITALS — BP 114/83 | HR 90 | Wt 147.7 lb

## 2017-12-31 DIAGNOSIS — Z789 Other specified health status: Secondary | ICD-10-CM

## 2017-12-31 DIAGNOSIS — Z3A36 36 weeks gestation of pregnancy: Secondary | ICD-10-CM | POA: Insufficient documentation

## 2017-12-31 DIAGNOSIS — O0993 Supervision of high risk pregnancy, unspecified, third trimester: Secondary | ICD-10-CM | POA: Diagnosis present

## 2017-12-31 DIAGNOSIS — O0990 Supervision of high risk pregnancy, unspecified, unspecified trimester: Secondary | ICD-10-CM

## 2017-12-31 DIAGNOSIS — Z8759 Personal history of other complications of pregnancy, childbirth and the puerperium: Secondary | ICD-10-CM

## 2017-12-31 DIAGNOSIS — Z86718 Personal history of other venous thrombosis and embolism: Secondary | ICD-10-CM

## 2017-12-31 LAB — OB RESULTS CONSOLE GBS: GBS: NEGATIVE

## 2017-12-31 NOTE — Progress Notes (Signed)
Prenatal Visit Note Date: 12/31/2017 Clinic: [facility]  Subjective:  Sherry Houston is a 32 y.o. G2P1001 at 4930w5d being seen today for ongoing prenatal care.  She is currently monitored for the following issues for this high-risk pregnancy and has Glossitis; Supervision of high risk pregnancy, antepartum; Maternal DVT (deep vein thrombosis), history of; Language barrier; and Factor 5 Leiden mutation, heterozygous (HCC) on their problem list.  Patient reports no complaints.   Contractions: Irritability. Vag. Bleeding: None.  Movement: Present. Denies leaking of fluid.   The following portions of the patient's history were reviewed and updated as appropriate: allergies, current medications, past family history, past medical history, past social history, past surgical history and problem list. Problem list updated.  Objective:   Vitals:   12/31/17 1327  BP: 114/83  Pulse: 90  Weight: 147 lb 11.2 oz (67 kg)    Fetal Status: Fetal Heart Rate (bpm): 156 Fundal Height: 37 cm Movement: Present  Presentation: Vertex  General:  Alert, oriented and cooperative. Patient is in no acute distress.  Skin: Skin is warm and dry. No rash noted.   Cardiovascular: Normal heart rate noted  Respiratory: Normal respiratory effort, no problems with respiration noted  Abdomen: Soft, gravid, appropriate for gestational age. Pain/Pressure: Present     Pelvic:  Cervical exam deferred Dilation: Fingertip Effacement (%): 20 Station: Ballotable  Extremities: Normal range of motion.  Edema: None  Mental Status: Normal mood and affect. Normal behavior. Normal judgment and thought content.   Urinalysis:      Assessment and Plan:  Pregnancy: G2P1001 at 5330w5d  1. Supervision of high risk pregnancy, antepartum Routine care. D/w pt more re: BC. Pt interested in condoms but I told her she can do progestin only options which she wasn't aware of. Can d/w pt more nv - Culture, beta strep  (group b only) - GC/Chlamydia probe amp ([place])not at [facility] - Cervicovaginal ancillary only  2. Language barrier Interpreter used  3. Maternal DVT (deep vein thrombosis), history of Continue lovenox 40 qday. Set up 39wk IOL nv. Pt amenable to IOL plan  Preterm labor symptoms and general obstetric precautions including but not limited to vaginal bleeding, contractions, leaking of fluid and fetal movement were reviewed in detail with the patient. Please refer to After Visit Summary for other counseling recommendations.  Return in about 1 week (around 01/07/2018) for 1wk rob.   Sherry Houston, Sherry Brissette, MD  Progestin only options

## 2018-01-01 LAB — CERVICOVAGINAL ANCILLARY ONLY
Chlamydia: NEGATIVE
Neisseria Gonorrhea: NEGATIVE

## 2018-01-04 LAB — CULTURE, BETA STREP (GROUP B ONLY): STREP GP B CULTURE: NEGATIVE

## 2018-01-07 ENCOUNTER — Ambulatory Visit (INDEPENDENT_AMBULATORY_CARE_PROVIDER_SITE_OTHER): Payer: Medicaid Other | Admitting: Family Medicine

## 2018-01-07 ENCOUNTER — Encounter (HOSPITAL_COMMUNITY): Payer: Self-pay | Admitting: *Deleted

## 2018-01-07 ENCOUNTER — Telehealth (HOSPITAL_COMMUNITY): Payer: Self-pay | Admitting: *Deleted

## 2018-01-07 VITALS — BP 108/49 | HR 122 | Wt 149.5 lb

## 2018-01-07 DIAGNOSIS — Z8759 Personal history of other complications of pregnancy, childbirth and the puerperium: Secondary | ICD-10-CM

## 2018-01-07 DIAGNOSIS — D6851 Activated protein C resistance: Secondary | ICD-10-CM

## 2018-01-07 DIAGNOSIS — O0993 Supervision of high risk pregnancy, unspecified, third trimester: Secondary | ICD-10-CM

## 2018-01-07 DIAGNOSIS — Z789 Other specified health status: Secondary | ICD-10-CM

## 2018-01-07 DIAGNOSIS — Z86718 Personal history of other venous thrombosis and embolism: Secondary | ICD-10-CM

## 2018-01-07 DIAGNOSIS — O0990 Supervision of high risk pregnancy, unspecified, unspecified trimester: Secondary | ICD-10-CM

## 2018-01-07 NOTE — Progress Notes (Signed)
   PRENATAL VISIT NOTE  Subjective:  Sherry Houston is a 32 y.o. G2P1001 at 1065w5d being seen today for ongoing prenatal care.  She is currently monitored for the following issues for this high-risk pregnancy and has Glossitis; Supervision of high risk pregnancy, antepartum; Maternal DVT (deep vein thrombosis), history of; Language barrier; and Factor 5 Leiden mutation, heterozygous (HCC) on their problem list.  Patient reports no complaints.  Contractions: Irritability. Vag. Bleeding: None.  Movement: Present. Denies leaking of fluid.   The following portions of the patient's history were reviewed and updated as appropriate: allergies, current medications, past family history, past medical history, past social history, past surgical history and problem list. Problem list updated.  Objective:   Vitals:   01/07/18 1317  BP: (!) 108/49  Pulse: (!) 122  Weight: 149 lb 8 oz (67.8 kg)    Fetal Status: Fetal Heart Rate (bpm): 166   Movement: Present     General:  Alert, oriented and cooperative. Patient is in no acute distress.  Skin: Skin is warm and dry. No rash noted.   Cardiovascular: Normal heart rate noted  Respiratory: Normal respiratory effort, no problems with respiration noted  Abdomen: Soft, gravid, appropriate for gestational age.  Pain/Pressure: Present     Pelvic: Cervical exam deferred        Extremities: Normal range of motion.  Edema: None  Mental Status: Normal mood and affect. Normal behavior. Normal judgment and thought content.   Assessment and Plan:  Pregnancy: G2P1001 at 595w5d  1. Supervision of high risk pregnancy, antepartum FHT and FH normal  2. Maternal DVT (deep vein thrombosis), history of 3. Factor 5 Leiden mutation, heterozygous (HCC) On lovenox. 39 week induction scheduled. No lovenox 24 hours prior. Come into MAU day before for Foley balloon placement  4. Language barrier Interpreter used.  Term labor symptoms and general obstetric precautions  including but not limited to vaginal bleeding, contractions, leaking of fluid and fetal movement were reviewed in detail with the patient. Please refer to After Visit Summary for other counseling recommendations.  Return in about 1 week (around 01/14/2018) for HR OB f/u.  Future Appointments  Date Time Provider Department Center  01/14/2018  1:15 PM Levie HeritageStinson, Juandaniel Manfredo J, DO [facility]  01/17/2018  8:00 AM [facility] RO[facility] None    Levie HeritageJacob J Urie Loughner, DO

## 2018-01-07 NOTE — Telephone Encounter (Signed)
Preadmission screen Interpreter number [phone]

## 2018-01-13 ENCOUNTER — Other Ambulatory Visit: Payer: Self-pay | Admitting: Family Medicine

## 2018-01-14 ENCOUNTER — Ambulatory Visit (INDEPENDENT_AMBULATORY_CARE_PROVIDER_SITE_OTHER): Payer: Medicaid Other | Admitting: Family Medicine

## 2018-01-14 VITALS — BP 110/61 | HR 91 | Wt 151.0 lb

## 2018-01-14 DIAGNOSIS — D6851 Activated protein C resistance: Secondary | ICD-10-CM

## 2018-01-14 DIAGNOSIS — Z86718 Personal history of other venous thrombosis and embolism: Secondary | ICD-10-CM

## 2018-01-14 DIAGNOSIS — Z8759 Personal history of other complications of pregnancy, childbirth and the puerperium: Secondary | ICD-10-CM

## 2018-01-14 DIAGNOSIS — O0990 Supervision of high risk pregnancy, unspecified, unspecified trimester: Secondary | ICD-10-CM

## 2018-01-14 DIAGNOSIS — O0993 Supervision of high risk pregnancy, unspecified, third trimester: Secondary | ICD-10-CM

## 2018-01-14 NOTE — Progress Notes (Signed)
   PRENATAL VISIT NOTE  Subjective:  Sherry Houston is a 32 y.o. G2P1001 at 9074w5d being seen today for ongoing prenatal care.  She is currently monitored for the following issues for this high-risk pregnancy and has Glossitis; Supervision of high risk pregnancy, antepartum; Maternal DVT (deep vein thrombosis), history of; Language barrier; and Factor 5 Leiden mutation, heterozygous (HCC) on their problem list.  Patient reports no complaints.  Contractions: Irregular. Vag. Bleeding: None.  Movement: Present. Denies leaking of fluid.   The following portions of the patient's history were reviewed and updated as appropriate: allergies, current medications, past family history, past medical history, past social history, past surgical history and problem list. Problem list updated.  Objective:   Vitals:   01/14/18 1331  BP: 110/61  Pulse: 91  Weight: 151 lb (68.5 kg)    Fetal Status: Fetal Heart Rate (bpm): 147 Fundal Height: 39 cm Movement: Present     General:  Alert, oriented and cooperative. Patient is in no acute distress.  Skin: Skin is warm and dry. No rash noted.   Cardiovascular: Normal heart rate noted  Respiratory: Normal respiratory effort, no problems with respiration noted  Abdomen: Soft, gravid, appropriate for gestational age.  Pain/Pressure: Present     Pelvic: Cervical exam deferred        Extremities: Normal range of motion.  Edema: None  Mental Status: Normal mood and affect. Normal behavior. Normal judgment and thought content.   Assessment and Plan:  Pregnancy: G2P1001 at 3374w5d  1. Supervision of high risk pregnancy, antepartum FHT and FH normal. Induce on Sunday. Foley balloon day prior in MAU  2. Maternal DVT (deep vein thrombosis), history of Do not take lovenox day before induction.  3. Factor 5 Leiden mutation, heterozygous [facility](HCC)  Term labor symptoms and general obstetric precautions including but not limited to vaginal bleeding, contractions,  leaking of fluid and fetal movement were reviewed in detail with the patient. Please refer to After Visit Summary for other counseling recommendations.  Return in about 5 weeks (around 02/18/2018) for Postpartum Exam.  Future Appointments  Date Time Provider Department Center  01/17/2018  8:00 AM [facility] None    Sherry HeritageJacob J Stein Windhorst, DO

## 2018-01-16 ENCOUNTER — Encounter (HOSPITAL_COMMUNITY): Payer: Self-pay | Admitting: *Deleted

## 2018-01-16 ENCOUNTER — Inpatient Hospital Stay (HOSPITAL_COMMUNITY)
Admission: AD | Admit: 2018-01-16 | Discharge: 2018-01-16 | Disposition: A | Discharge status: Home / Self Care | Payer: Medicaid Other | Source: Ambulatory Visit | Attending: Family Medicine | Admitting: Family Medicine

## 2018-01-16 DIAGNOSIS — Z3A39 39 weeks gestation of pregnancy: Secondary | ICD-10-CM

## 2018-01-16 DIAGNOSIS — Z3490 Encounter for supervision of normal pregnancy, unspecified, unspecified trimester: Secondary | ICD-10-CM

## 2018-01-16 DIAGNOSIS — D6851 Activated protein C resistance: Secondary | ICD-10-CM

## 2018-01-16 NOTE — Discharge Instructions (Signed)

## 2018-01-16 NOTE — MAU Provider Note (Signed)
   Outpatient Foley Bulb Insertion (MAU)   Subjective:  Sherry Houston is a 32 y.o. G2P1001 at 5989w0d being seen today for ongoing prenatal care.  She is currently monitored for the following issues for this high-risk pregnancy and has Glossitis; Supervision of high risk pregnancy, antepartum; Maternal DVT (deep vein thrombosis), history of; Language barrier; and Factor 5 Leiden mutation, heterozygous (HCC) on their problem list.  Patient reports no complaints. Vag. Bleeding: None. Denies leaking of fluid.   The following portions of the patient's history were reviewed and updated as appropriate: allergies, current medications, past family history, past medical history, past social history, past surgical history and problem list. Problem list updated.  Objective:   Vitals:   01/16/18 1641 01/16/18 1644  BP:  (!) 122/58  Pulse: 99   Resp: 16   Temp:  98.3 F (36.8 C)  TempSrc:  Oral  SpO2: 95%   Weight: 68.9 kg     Fetal Status: Cephalic by BSUS. Leopold's 6-7lbs.   General:  Alert, oriented and cooperative. Patient is in no acute distress.  Skin: Skin is warm and dry. No rash noted.   Cardiovascular: Normal heart rate noted  Respiratory: Normal respiratory effort, no problems with respiration noted  Abdomen: Soft, gravid, appropriate for gestational age.        Pelvic: Cervical exam performed SVE 1-2/50/ballotable  Extremities: Normal range of motion.     Mental Status:  Normal mood and affect. Normal behavior. Normal judgment and thought content.  Procedure: Patient informed of R/B/A of procedure. NST was performed and was reactive prior to procedure. NST:  EFM: Baseline: 150s bpm Toco: irregular Procedure done to begin ripening of the cervix prior to admission for induction of labor. Appropriate time out taken. The patient was placed in the lithotomy position and the cervix brought into view with sterile speculum. A ring forcep was used to guide the cook catheterfoley  balloon through the internal os of the cervix. Foley Balloon filled with 60cc of normal saline. Plug inserted into end of the foley. Foley placed without tension and taped to medial thigh. Placement confirmed with SVE after procedure and foley balloon was already starting to come through the cervix.   NST:  EFM Baseline: 140-150s bpm  moderate  +a  -d  Toco: irregular There were no signs of tachysystole or hypertonus. All equipment was removed and accounted for. The patient tolerated the procedure well.   Assessment and Plan:  Pregnancy: G2P1001 at 10089w0d here for outpatient FB placement. Patient being induced tomorrow morning for factor 5 Leiden. s/p Outpatient placement of foley balloon catheter for cervical ripening.  Induction of labor scheduled for tomorrow at 0800. If FB out prior to time of induction, patient to report to MAU and will plan to start induction at that time. Reassuring FHR tracing with no concerns at present. Warning signs given to patient to include return to MAU for heavy vaginal bleeding, Rupture of membranes, painful uterine contractions q 5 mins or less, severe abdominal discomfort, decreased fetal movement.  Tamera StandsLaurel S Wallace, DO 01/16/2018 6:12 PM

## 2018-01-16 NOTE — MAU Note (Signed)
Rachael Feesther Solis Alanis is a 32 y.o. at 7172w0d here in MAU: Presents for foley bulb insertion Denies any complaints at this time.  Vitals:   01/16/18 1641 01/16/18 1644  BP:  (!) 122/58  Pulse: 99   Resp: 16   Temp:  98.3 F (36.8 C)  SpO2: 95%      Lab orders placed from triage: none

## 2018-01-16 NOTE — MAU Note (Signed)
Pt here for foley bulb insertion. Having some vaginal discharge.

## 2018-01-17 ENCOUNTER — Other Ambulatory Visit: Payer: Self-pay

## 2018-01-17 ENCOUNTER — Inpatient Hospital Stay (HOSPITAL_COMMUNITY): Payer: Medicaid Other | Admitting: Anesthesiology

## 2018-01-17 ENCOUNTER — Encounter (HOSPITAL_COMMUNITY): Payer: Self-pay

## 2018-01-17 ENCOUNTER — Inpatient Hospital Stay (HOSPITAL_COMMUNITY)
Admission: RE | Admit: 2018-01-17 | Discharge: 2018-01-19 | DRG: 768 | Disposition: A | Discharge status: Home / Self Care | Payer: Medicaid Other | Attending: Obstetrics and Gynecology | Admitting: Obstetrics and Gynecology

## 2018-01-17 DIAGNOSIS — D6851 Activated protein C resistance: Secondary | ICD-10-CM | POA: Diagnosis present

## 2018-01-17 DIAGNOSIS — N898 Other specified noninflammatory disorders of vagina: Secondary | ICD-10-CM | POA: Diagnosis present

## 2018-01-17 DIAGNOSIS — Z3A39 39 weeks gestation of pregnancy: Secondary | ICD-10-CM

## 2018-01-17 DIAGNOSIS — N907 Vulvar cyst: Secondary | ICD-10-CM | POA: Diagnosis not present

## 2018-01-17 DIAGNOSIS — O0990 Supervision of high risk pregnancy, unspecified, unspecified trimester: Secondary | ICD-10-CM

## 2018-01-17 DIAGNOSIS — Z8759 Personal history of other complications of pregnancy, childbirth and the puerperium: Secondary | ICD-10-CM

## 2018-01-17 DIAGNOSIS — O9989 Other specified diseases and conditions complicating pregnancy, childbirth and the puerperium: Secondary | ICD-10-CM | POA: Diagnosis present

## 2018-01-17 DIAGNOSIS — O9912 Other diseases of the blood and blood-forming organs and certain disorders involving the immune mechanism complicating childbirth: Principal | ICD-10-CM | POA: Diagnosis present

## 2018-01-17 DIAGNOSIS — Z86718 Personal history of other venous thrombosis and embolism: Secondary | ICD-10-CM

## 2018-01-17 DIAGNOSIS — O99119 Other diseases of the blood and blood-forming organs and certain disorders involving the immune mechanism complicating pregnancy, unspecified trimester: Secondary | ICD-10-CM | POA: Diagnosis present

## 2018-01-17 LAB — CBC
HCT: 35.2 % — ABNORMAL LOW (ref 36.0–46.0)
Hemoglobin: 11.3 g/dL — ABNORMAL LOW (ref 12.0–15.0)
MCH: 27 pg (ref 26.0–34.0)
MCHC: 32.1 g/dL (ref 30.0–36.0)
MCV: 84.2 fL (ref 78.0–100.0)
PLATELETS: 235 10*3/uL (ref 150–400)
RBC: 4.18 MIL/uL (ref 3.87–5.11)
RDW: 16.6 % — ABNORMAL HIGH (ref 11.5–15.5)
WBC: 8.5 10*3/uL (ref 4.0–10.5)

## 2018-01-17 LAB — TYPE AND SCREEN
ABO/RH(D): A POS
ANTIBODY SCREEN: NEGATIVE

## 2018-01-17 LAB — ABO/RH: ABO/RH(D): A POS

## 2018-01-17 LAB — RPR: RPR: NONREACTIVE

## 2018-01-17 MED ORDER — SODIUM CHLORIDE 0.9% FLUSH: 3.0000 mL | INTRAVENOUS | Status: DC | PRN

## 2018-01-17 MED ORDER — TERBUTALINE SULFATE 1 MG/ML IJ SOLN
0.2500 mg | Freq: Once | INTRAMUSCULAR | Status: DC | PRN
  Filled 2018-01-17: qty 1

## 2018-01-17 MED ORDER — ONDANSETRON HCL 4 MG/2ML IJ SOLN: 4.0000 mg | Freq: Four times a day (QID) | INTRAMUSCULAR | Status: DC | PRN

## 2018-01-17 MED ORDER — BENZOCAINE-MENTHOL 20-0.5 % EX AERO
1.0000 "application " | INHALATION_SPRAY | CUTANEOUS | Status: DC | PRN
  Administered 2018-01-17: 1 via TOPICAL
  Filled 2018-01-17: qty 56

## 2018-01-17 MED ORDER — PRENATAL MULTIVITAMIN CH
1.0000 | ORAL_TABLET | Freq: Every day | ORAL | Status: DC
  Administered 2018-01-18 – 2018-01-19 (×2): 1 via ORAL
  Filled 2018-01-17 (×2): qty 1

## 2018-01-17 MED ORDER — PHENYLEPHRINE 40 MCG/ML (10ML) SYRINGE FOR IV PUSH (FOR BLOOD PRESSURE SUPPORT)
PREFILLED_SYRINGE | INTRAVENOUS | Status: AC
  Filled 2018-01-17: qty 20

## 2018-01-17 MED ORDER — LACTATED RINGERS IV SOLN: 500.0000 mL | INTRAVENOUS | Status: DC | PRN

## 2018-01-17 MED ORDER — ONDANSETRON HCL 4 MG/2ML IJ SOLN: 4.0000 mg | INTRAMUSCULAR | Status: DC | PRN

## 2018-01-17 MED ORDER — METHYLERGONOVINE MALEATE 0.2 MG/ML IJ SOLN
0.2000 mg | Freq: Once | INTRAMUSCULAR | Status: AC
  Administered 2018-01-17: 0.2 mg via INTRAMUSCULAR

## 2018-01-17 MED ORDER — PHENYLEPHRINE 40 MCG/ML (10ML) SYRINGE FOR IV PUSH (FOR BLOOD PRESSURE SUPPORT)
80.0000 ug | PREFILLED_SYRINGE | INTRAVENOUS | Status: DC | PRN
  Filled 2018-01-17: qty 5

## 2018-01-17 MED ORDER — COCONUT OIL OIL
1.0000 "application " | TOPICAL_OIL | Status: DC | PRN
  Administered 2018-01-19: 1 via TOPICAL
  Filled 2018-01-17: qty 120

## 2018-01-17 MED ORDER — ACETAMINOPHEN 325 MG PO TABS: 650.0000 mg | ORAL_TABLET | ORAL | Status: DC | PRN

## 2018-01-17 MED ORDER — FENTANYL CITRATE (PF) 100 MCG/2ML IJ SOLN: 100.0000 ug | INTRAMUSCULAR | Status: DC | PRN

## 2018-01-17 MED ORDER — ZOLPIDEM TARTRATE 5 MG PO TABS: 5.0000 mg | ORAL_TABLET | Freq: Every evening | ORAL | Status: DC | PRN

## 2018-01-17 MED ORDER — LIDOCAINE HCL (PF) 1 % IJ SOLN
30.0000 mL | INTRAMUSCULAR | Status: DC | PRN
  Filled 2018-01-17: qty 30

## 2018-01-17 MED ORDER — FENTANYL 2.5 MCG/ML BUPIVACAINE 1/10 % EPIDURAL INFUSION (WH - ANES)
14.0000 mL/h | INTRAMUSCULAR | Status: DC | PRN
  Administered 2018-01-17: 14 mL/h via EPIDURAL

## 2018-01-17 MED ORDER — MEASLES, MUMPS & RUBELLA VAC ~~LOC~~ INJ: 0.5000 mL | INJECTION | Freq: Once | SUBCUTANEOUS | Status: DC

## 2018-01-17 MED ORDER — OXYTOCIN 40 UNITS IN LACTATED RINGERS INFUSION - SIMPLE MED
1.0000 m[IU]/min | INTRAVENOUS | Status: DC
  Administered 2018-01-17: 2 m[IU]/min via INTRAVENOUS

## 2018-01-17 MED ORDER — FLEET ENEMA 7-19 GM/118ML RE ENEM: 1.0000 | ENEMA | RECTAL | Status: DC | PRN

## 2018-01-17 MED ORDER — LACTATED RINGERS IV SOLN: 500.0000 mL | Freq: Once | INTRAVENOUS | Status: DC

## 2018-01-17 MED ORDER — SODIUM CHLORIDE 0.9 % IV SOLN: 250.0000 mL | INTRAVENOUS | Status: DC | PRN

## 2018-01-17 MED ORDER — SENNOSIDES-DOCUSATE SODIUM 8.6-50 MG PO TABS
2.0000 | ORAL_TABLET | ORAL | Status: DC
  Administered 2018-01-18 (×2): 2 via ORAL
  Filled 2018-01-17 (×2): qty 2

## 2018-01-17 MED ORDER — DIBUCAINE 1 % RE OINT: 1.0000 "application " | TOPICAL_OINTMENT | RECTAL | Status: DC | PRN

## 2018-01-17 MED ORDER — SIMETHICONE 80 MG PO CHEW: 80.0000 mg | CHEWABLE_TABLET | ORAL | Status: DC | PRN

## 2018-01-17 MED ORDER — DIPHENHYDRAMINE HCL 50 MG/ML IJ SOLN: 12.5000 mg | INTRAMUSCULAR | Status: DC | PRN

## 2018-01-17 MED ORDER — FENTANYL 2.5 MCG/ML BUPIVACAINE 1/10 % EPIDURAL INFUSION (WH - ANES)
INTRAMUSCULAR | Status: AC
  Filled 2018-01-17: qty 100

## 2018-01-17 MED ORDER — LACTATED RINGERS IV SOLN
INTRAVENOUS | Status: DC
  Administered 2018-01-17 (×2): via INTRAVENOUS

## 2018-01-17 MED ORDER — TETANUS-DIPHTH-ACELL PERTUSSIS 5-2.5-18.5 LF-MCG/0.5 IM SUSP: 0.5000 mL | Freq: Once | INTRAMUSCULAR | Status: DC

## 2018-01-17 MED ORDER — ONDANSETRON HCL 4 MG PO TABS: 4.0000 mg | ORAL_TABLET | ORAL | Status: DC | PRN

## 2018-01-17 MED ORDER — WITCH HAZEL-GLYCERIN EX PADS: 1.0000 "application " | MEDICATED_PAD | CUTANEOUS | Status: DC | PRN

## 2018-01-17 MED ORDER — SODIUM CHLORIDE 0.9% FLUSH: 3.0000 mL | Freq: Two times a day (BID) | INTRAVENOUS | Status: DC

## 2018-01-17 MED ORDER — EPHEDRINE 5 MG/ML INJ
10.0000 mg | INTRAVENOUS | Status: DC | PRN
  Filled 2018-01-17: qty 2

## 2018-01-17 MED ORDER — DIPHENHYDRAMINE HCL 25 MG PO CAPS: 25.0000 mg | ORAL_CAPSULE | Freq: Four times a day (QID) | ORAL | Status: DC | PRN

## 2018-01-17 MED ORDER — OXYTOCIN BOLUS FROM INFUSION
500.0000 mL | Freq: Once | INTRAVENOUS | Status: AC
  Administered 2018-01-17: 500 mL via INTRAVENOUS

## 2018-01-17 MED ORDER — LIDOCAINE HCL (PF) 1 % IJ SOLN
INTRAMUSCULAR | Status: DC | PRN
Start: 2018-01-17 — End: 2018-01-17
  Administered 2018-01-17: 8 mL via EPIDURAL

## 2018-01-17 MED ORDER — METHYLERGONOVINE MALEATE 0.2 MG/ML IJ SOLN
INTRAMUSCULAR | Status: AC
  Administered 2018-01-17: 0.2 mg via INTRAMUSCULAR
  Filled 2018-01-17: qty 1

## 2018-01-17 MED ORDER — IBUPROFEN 600 MG PO TABS
600.0000 mg | ORAL_TABLET | Freq: Four times a day (QID) | ORAL | Status: DC
  Administered 2018-01-18 – 2018-01-19 (×7): 600 mg via ORAL
  Filled 2018-01-17 (×7): qty 1

## 2018-01-17 MED ORDER — OXYTOCIN 40 UNITS IN LACTATED RINGERS INFUSION - SIMPLE MED
2.5000 [IU]/h | INTRAVENOUS | Status: DC
  Administered 2018-01-17: 2.5 [IU]/h via INTRAVENOUS
  Filled 2018-01-17: qty 1000

## 2018-01-17 MED ORDER — SOD CITRATE-CITRIC ACID 500-334 MG/5ML PO SOLN: 30.0000 mL | ORAL | Status: DC | PRN

## 2018-01-17 NOTE — Progress Notes (Signed)
Sherry Houston is a 32 y.o. G2P1001 at 4729w1d by ultrasound admitted for induction of labor due to Factor V and DVT.  Subjective:   Objective: BP 104/61   Pulse 85   Temp 98.4 F (36.9 C) (Axillary)   Resp 18   Ht 5\' 2"  (1.575 m)   Wt 68.4 kg   LMP 04/18/2017 (Approximate)   BMI 27.60 kg/m  No intake/output data recorded. No intake/output data recorded.  FHT:  FHR: 135-140 bpm, variability: moderate,  accelerations:  Present,  decelerations:  Absent UC:   regular, every 4-5 minutes mild intensity SVE:      Labs: Lab Results  Component Value Date   WBC 8.5 01/17/2018   HGB 11.3 (L) 01/17/2018   HCT 35.2 (L) 01/17/2018   MCV 84.2 01/17/2018   PLT 235 01/17/2018    Assessment / Plan: [facility],  progressing well on pitocin  Labor: Progressing normally Preeclampsia:  no signs or symptoms of toxicity Fetal Wellbeing:  Category I Pain Control:  Labor support without medications I/D:  n/a Anticipated MOD:  NSVD  Sherry Houston 01/17/2018, 11:29 AM

## 2018-01-17 NOTE — Anesthesia Preprocedure Evaluation (Signed)
Anesthesia Evaluation  Patient identified by MRN, date of birth, ID band Patient awake    Reviewed: Allergy & Precautions, H&P , NPO status , Patient's Chart, lab work & pertinent test results, reviewed documented beta blocker date and time   Airway Mallampati: I  TM Distance: >3 FB Neck ROM: full    Dental no notable dental hx.    Pulmonary neg pulmonary ROS,    Pulmonary exam normal breath sounds clear to auscultation       Cardiovascular negative cardio ROS Normal cardiovascular exam Rhythm:regular Rate:Normal     Neuro/Psych negative neurological ROS  negative psych ROS   GI/Hepatic negative GI ROS, Neg liver ROS,   Endo/Other  negative endocrine ROS  Renal/GU negative Renal ROS  negative genitourinary   Musculoskeletal   Abdominal   Peds  Hematology FACTOR 5   Anesthesia Other Findings   Reproductive/Obstetrics (+) Pregnancy                             Anesthesia Physical Anesthesia Plan  ASA: III  Anesthesia Plan: Epidural   Post-op Pain Management:    Induction:   PONV Risk Score and Plan:   Airway Management Planned:   Additional Equipment:   Intra-op Plan:   Post-operative Plan:   Informed Consent: I have reviewed the patients History and Physical, chart, labs and discussed the procedure including the risks, benefits and alternatives for the proposed anesthesia with the patient or authorized representative who has indicated his/her understanding and acceptance.   Dental Advisory Given  Plan Discussed with: CRNA, Anesthesiologist and Surgeon  Anesthesia Plan Comments: (Labs checked- platelets confirmed with RN in room. Fetal heart tracing, per RN, reported to be stable enough for sitting procedure. Discussed epidural, and patient consents to the procedure:  included risk of possible headache,backache, failed block, allergic reaction, and nerve injury. This patient  was asked if she had any questions or concerns before the procedure started.)        Anesthesia Quick Evaluation

## 2018-01-17 NOTE — Lactation Note (Signed)
This note was copied from a baby's chart. Lactation Consultation Note Baby 2 hrs old. Spanish speaking mom Sherry Houston interpreter present for consult. Mom BF and formula fed her 1st child now 434 yrs old for 4 month until she got mastitis. Mom stated she formula fed more than BF. Discussed risk of formula and breast feeding causing decreased milk supply and mastitis. Mom stated she wants to BF more than formula feed this time, but does want to formula feed some if needed feeling that she didn't have enough for the baby. Discussed supply and demand.  Reviewed basic BF. Baby is cold having to go to CN to be under warmer. Hand expression taught, collected 1 ml colostrum. Taking to CN to give to baby via curve tip syring and gloved finger. Mom has everted nipples. Mom encouraged to feed baby 8-12 times/24 hours and with feeding cues.  Call for assistance when needed. WH/LC brochure given w/resources, support groups and LC services. Patient Name: Sherry [name] [id number]'s Date: 01/17/2018 Reason for consult: Initial assessment   Maternal Data Formula Feeding for Exclusion: Yes Reason for exclusion: Mother's choice to formula feed on admision Has patient been taught Hand Expression?: Yes Does the patient have breastfeeding experience prior to this delivery?: Yes  Feeding    LATCH Score Latch: Too sleepy or reluctant, no latch achieved, no sucking elicited.  Audible Swallowing: None  Type of Nipple: Everted at rest and after stimulation  Comfort (Breast/Nipple): Soft / non-tender        Interventions Interventions: Breast feeding basics reviewed;Skin to skin;Expressed milk;Breast massage;Hand express;Breast compression  Lactation Tools Discussed/Used WIC Program: Yes   Consult Status Consult Status: Follow-up Date: 01/18/18 Follow-up type: In-patient    Charyl DancerCARVER, Sherry Rennaker G 01/17/2018, 9:32 PM

## 2018-01-17 NOTE — Lactation Note (Signed)
This note was copied from a baby's chart. Lactation Consultation Note Gave baby 1 ml w/gloved finger and curve tip syring while under warmer. noted baby jittery. Had blood glucose lab drawn.  Patient Name: Sherry Houston WUJWJ'XToday's Date: 01/17/2018 Reason for consult: Initial assessment   Maternal Data Formula Feeding for Exclusion: Yes Reason for exclusion: Mother's choice to formula feed on admision Has patient been taught Hand Expression?: Yes Does the patient have breastfeeding experience prior to this delivery?: Yes  Feeding Feeding Type: Breast Milk  LATCH Score Latch: Too sleepy or reluctant, no latch achieved, no sucking elicited.  Audible Swallowing: None  Type of Nipple: Everted at rest and after stimulation  Comfort (Breast/Nipple): Soft / non-tender        Interventions Interventions: Breast feeding basics reviewed;Skin to skin;Expressed milk;Breast massage;Hand express;Breast compression  Lactation Tools Discussed/Used WIC Program: Yes   Consult Status Consult Status: Follow-up Date: 01/17/18 Follow-up type: In-patient    Charyl DancerCARVER, Jaedan Huttner G 01/17/2018, 9:44 PM

## 2018-01-17 NOTE — Anesthesia Procedure Notes (Signed)
Epidural Patient location during procedure: OB Start time: 01/17/2018 6:00 PM End time: 01/17/2018 6:10 PM  Staffing Anesthesiologist: Bethena Midgetddono, Marguerite Barba, MD  Preanesthetic Checklist Completed: patient identified, site marked, surgical consent, pre-op evaluation, timeout performed, IV checked, risks and benefits discussed and monitors and equipment checked  Epidural Patient position: sitting Prep: site prepped and draped and DuraPrep Patient monitoring: continuous pulse ox and blood pressure Approach: midline Location: L3-L4 Injection technique: LOR air  Needle:  Needle type: Tuohy  Needle gauge: 17 G Needle length: 9 cm and 9 Needle insertion depth: 6 cm Catheter type: closed end flexible Catheter size: 19 Gauge Catheter at skin depth: 11 cm Test dose: negative  Assessment Events: blood not aspirated, injection not painful, no injection resistance, negative IV test and no paresthesia

## 2018-01-17 NOTE — Anesthesia Pain Management Evaluation Note (Signed)
  CRNA Pain Management Visit Note  Patient: Sherry HurtEsther Solis Alanis, 32 y.o., female  "Hello I am a member of the anesthesia team at [facility]. We have an anesthesia team available at all times to provide care throughout the hospital, including epidural management and anesthesia for C-section. I don't know your plan for the delivery whether it a natural birth, water birth, IV sedation, nitrous supplementation, doula or epidural, but we want to meet your pain goals."   1.Was your pain managed to your expectations on prior hospitalizations?   Yes   2.What is your expectation for pain management during this hospitalization?     Epidural  3.How can we help you reach that goal? Epidural   Record the patient's initial score and the patient's pain goal.   Pain: 0  Pain Goal: 7 [facility] wants you to be able to say your pain was always managed very well.  Rica RecordsICKELTON,Sheriann Newmann 01/17/2018

## 2018-01-18 MED ORDER — ENOXAPARIN SODIUM 40 MG/0.4ML ~~LOC~~ SOLN
40.0000 mg | SUBCUTANEOUS | Status: DC
  Administered 2018-01-18 – 2018-01-19 (×2): 40 mg via SUBCUTANEOUS
  Filled 2018-01-18 (×2): qty 0.4

## 2018-01-18 NOTE — Anesthesia Postprocedure Evaluation (Signed)
Anesthesia Post Note  Patient: Sherry Houston  Procedure(s) Performed: AN AD HOC LABOR EPIDURAL     Patient location during evaluation: Mother Baby Anesthesia Type: Epidural Level of consciousness: awake and alert Pain management: pain level controlled Vital Signs Assessment: post-procedure vital signs reviewed and stable Respiratory status: spontaneous breathing, nonlabored ventilation and respiratory function stable Cardiovascular status: stable Postop Assessment: no headache, no backache, epidural receding, no apparent nausea or vomiting, patient able to bend at knees, adequate PO intake and able to ambulate Anesthetic complications: no    Last Vitals:  Vitals:   01/18/18 0205 01/18/18 0500  BP: (!) 98/55 (!) 94/55  Pulse: 79 86  Resp: 18 18  Temp: 37.1 C 36.9 C  SpO2: 96% 91%    Last Pain:  Vitals:   01/18/18 0752  TempSrc:   PainSc: 2    Pain Goal:                 Laban EmperorMalinova,Asa Baudoin Hristova

## 2018-01-18 NOTE — Addendum Note (Signed)
Addendum  created 01/18/18 0827 by Elgie CongoMalinova, Jeanifer Halliday H, CRNA   Charge Capture section accepted, Sign clinical note

## 2018-01-18 NOTE — Progress Notes (Signed)
Sherry Houston, interpreter, assisted in m/b room with pediatric assessment, nursery assessment, lactation, and this nurse's teaching and first and second assessment.  BREAKFAST HAS BEEN ORDERED.  WHITE BOARD has been placed in room.  Raquel also helped relay any questions the family might have for this nurse.

## 2018-01-18 NOTE — Progress Notes (Signed)
POSTPARTUM PROGRESS NOTE  Post Partum Day 1 Subjective:  Sherry Houston is a 32 y.o. [id number] 3076w1d s/p VAVD, IOL for Factor V and DVT history.  No acute events overnight.  Pt denies problems with ambulating, voiding or po intake.  She denies nausea or vomiting.  Pain is moderately controlled. Moderate cramping in lower abdomen. Lochia Moderate. Bleeding like a period, changing pad q4 hrs. No light headedness/dizziness, vision changes, headache, SOB, chest pain, RUQ tenderness.    Objective: Blood pressure (!) 94/55, pulse 86, temperature 98.5 F (36.9 C), temperature source Oral, resp. rate 18, height 5\' 2"  (1.575 m), weight 68.4 kg, last menstrual period 04/18/2017, SpO2 91 %, unknown if currently breastfeeding.  Physical Exam:  General: alert, cooperative and no distress Lochia:normal flow Chest: no respiratory distress Heart:regular rate, distal pulses intact Abdomen: soft, nontender,  Uterine Fundus: firm, appropriately tender DVT Evaluation: No calf swelling or tenderness Extremities: Right foot slightly edematous but not erythematous;  Recent Labs    01/17/18 0818  HGB 11.3*  HCT 35.2*    Assessment/Plan:  ASSESSMENT: Sherry Houston is a 32 y.o. [id number] 2076w1d s/p VAVD for IOL for Factor V and DVT history.   -Breastfeeding -Plans for IUD -Plan for DVT ppx is to start Lovenox. Was on Lovenox 40mg  during this pregnancy. Was on 40 mg Lovenox during previous pregnancy in 2015. Hx of DVT in 5th month of previous pregnancy. Took Lovenox for 40 days after previous delivery. Precautions re: red and swollen lower limbs shared.    LOS: 1 day   Sherry Houston MS3 01/18/2018, 7:54 AM   OB FELLOW MEDICAL STUDENT NOTE ATTESTATION  I confirm that I have verified the information documented in the medical student's note and that I have also personally performed the physical exam and all medical decision making activities.   Sherry Houston is 32 y.o. [phone] female PPD  #1 from SVD with history of Factor V Leiden and DVT in previous pregnancy. Patient reports feeling well. Ambulating without difficulty, voiding normally, lochia is mild, pain is well controlled. Vital signs stable. Physical exam benign with firm uterine fundus and no LE edema. Will re-start Lovenox 40 mg as is now 12 hours PP. Anticipate need for 6 weeks of medication after delivery. Patient is breast feeding. Plans for IUD for contraception.  Plan for d/c tomorrow.   Sherry Houston, D.O. OB Fellow  01/18/2018, 12:09 PM

## 2018-01-18 NOTE — Progress Notes (Signed)
This nurse used "DennisJasmine, [phone]" the interpreter [place] to answer questions parents had as to why baby wanted only to be held (and not sleep in the crib).

## 2018-01-18 NOTE — H&P (Signed)
Obstetric History and Physical  Sherry Houston is a 32 y.o. [phone] with IUP at 933w1d admitted for IOL for Factor V Leiden and h/o DVT on lovenox.  Medical History:  Past Medical History:  Diagnosis Date  . Factor 5 Leiden mutation, heterozygous (HCC) 09/16/2017  . Obstetrical blood clot embolism in second trimester 2014   LLE. Diagnosed in first pregnancy    Past Surgical History:  Procedure Laterality Date  . NO PAST SURGERIES      OB History  Gravida Para Term Preterm AB Living  2 2 2     2   SAB TAB Ectopic Multiple Live Births        0 2    # Outcome Date GA Lbr Len/2nd Weight Sex Delivery Anes PTL Lv  2 Term 01/17/18 733w1d 06:10 / 00:23 3714 g M Vag-Vacuum EPI  LIV  1 Term 07/01/13    F Vag-Spont Local  LIV     Birth Comments: System Generated. Please review and update pregnancy details.    Social History   Socioeconomic History  . Marital status: Married    Spouse name: Not on file  . Number of children: Not on file  . Years of education: Not on file  . Highest education level: Not on file  Occupational History  . Not on file  Social Needs  . Financial resource strain: Not hard at all  . Food insecurity:    Worry: Never true    Inability: Never true  . Transportation needs:    Medical: No    Non-medical: No  Tobacco Use  . Smoking status: Never Smoker  . Smokeless tobacco: Never Used  Substance and Sexual Activity  . Alcohol use: No    Alcohol/week: 0.0 standard drinks  . Drug use: No  . Sexual activity: Yes    Birth control/protection: None    Comment: last sex three days  Lifestyle  . Physical activity:    Days per week: Not on file    Minutes per session: Not on file  . Stress: Not on file  Relationships  . Social connections:    Talks on phone: Not on file    Gets together: Not on file    Attends religious service: Not on file    Active member of club or organization: Not on file    Attends meetings of clubs or organizations: Not  on file    Relationship status: Not on file  Other Topics Concern  . Not on file  Social History Narrative   ** Merged History Encounter **        Family History  Problem Relation Age of Onset  . Heart failure Sister     Medications Prior to Admission  Medication Sig Dispense Refill Last Dose  . prenatal vitamin w/FE, FA (PRENATAL 1 + 1) 27-1 MG TABS tablet Take 1 tablet by mouth daily at 12 noon.   Taking    No Known Allergies  Review of Systems: Negative except for what is mentioned in HPI.  Physical Exam: BP (!) 98/55 (BP Location: Left Arm)   Pulse 79   Temp 98.7 F (37.1 C) (Oral)   Resp 18   Ht 5\' 2"  (1.575 m)   Wt 68.4 kg   LMP 04/18/2017 (Approximate)   SpO2 96%   Breastfeeding? Unknown   BMI 27.60 kg/m  Gen: alert, oriented  FHT: per progress note Toco: per progress note   Pertinent Labs/Studies:   Results for orders  placed or performed during the hospital encounter of 01/17/18 (from the past 24 hour(s))  CBC     Status: Abnormal   Collection Time: 01/17/18  8:18 AM  Result Value Ref Range   WBC 8.5 4.0 - 10.5 K/uL   RBC 4.18 3.87 - 5.11 MIL/uL   Hemoglobin 11.3 (L) 12.0 - 15.0 g/dL   HCT 16.135.2 (L) 09.636.0 - 04.546.0 %   MCV 84.2 78.0 - 100.0 fL   MCH 27.0 26.0 - 34.0 pg   MCHC 32.1 30.0 - 36.0 g/dL   RDW 40.916.6 (H) 81.111.5 - 91.415.5 %   Platelets 235 150 - 400 K/uL  RPR     Status: None   Collection Time: 01/17/18  8:18 AM  Result Value Ref Range   RPR Ser Ql Non Reactive Non Reactive  Type and screen     Status: None   Collection Time: 01/17/18  8:18 AM  Result Value Ref Range   ABO/RH(D) A POS    Antibody Screen NEG    Sample Expiration      01/20/2018 Performed at [facility], [address] [place], [place] [address]   ABO/Rh     Status: None   Collection Time: 01/17/18  8:30 AM  Result Value Ref Range   ABO/RH(D)      A POS Performed at [facility], [address] [place], [place] [address]     Assessment : Sherry Houston  is a 32 y.o. [id number] at 6066w1d being admitted for induction of labor due to Factor V Leiden, h/o DVT.   Plan:  Labor  - Admit to labor and delivery - Induction/Augmentation as ordered as per protocol - Analgesia as needed  FWB  - Cat I fetal heart tracing   - GBS negative  Delivery plan - Hopeful for vaginal delivery    K. Therese SarahMeryl Shakeia Krus, M.D. Center for [facility]  01/18/2018, 3:45 AM

## 2018-01-18 NOTE — Lactation Note (Signed)
This note was copied from a baby's chart. Lactation Consultation Note Baby 7 hrs old. Mom called for BF assistance. Baby fussy.  FOB at bedside interpreting. Baby cues, roots, latches on breast, but will not suckle. Baby sweating. Removed swaddle and mom's gown from baby's side. STS, one blanket placed over outside of baby while feeding.  Mom has soft breast, everted nipples. Mom using scissor hold for latching. Taught "C" hold. Baby in football position. Support for mom's hand while holding baby. Mom pinching breast down. Baby has stuffy nose. Assisted in placement of hands, ease up on tension holding baby's head. Mom holding top of head at times. Encouraged not to in case baby has bruising.  Baby after multiple attempts, finally latched suckling at regular rhythm w/occassional swallow.  Mom had been trying to "milk" breast when trying to latch. Encouraged mom to keep breast firm and still until baby establishes latch and suckling well before moving breast and "milking". encouraged to express at intervals not constant. Mom states understanding per FOB.  Patient Name: Sherry Houston ZOXWR'UToday's Date: 01/18/2018 Reason for consult: Follow-up assessment;Mother's request   Maternal Data    Feeding Feeding Type: Breast Fed Length of feed: (still BF off and on)  LATCH Score Latch: Repeated attempts needed to sustain latch, nipple held in mouth throughout feeding, stimulation needed to elicit sucking reflex.  Audible Swallowing: A few with stimulation  Type of Nipple: Everted at rest and after stimulation  Comfort (Breast/Nipple): Soft / non-tender  Hold (Positioning): Assistance needed to correctly position infant at breast and maintain latch.  LATCH Score: 7  Interventions Interventions: Breast feeding basics reviewed;Support pillows;Assisted with latch;Position options;Skin to skin;Breast massage;Hand express;Breast compression;Adjust position  Lactation Tools  Discussed/Used     Consult Status Consult Status: Follow-up Date: 01/18/18 Follow-up type: In-patient    Nevaeha Finerty, Diamond NickelLAURA G 01/18/2018, 1:48 AM

## 2018-01-18 NOTE — Anesthesia Postprocedure Evaluation (Signed)
Anesthesia Post Note  Patient: Sherry Houston  Procedure(s) Performed: AN AD HOC LABOR EPIDURAL     Patient location during evaluation: Mother Baby Anesthesia Type: Epidural Level of consciousness: awake and alert Pain management: pain level controlled Vital Signs Assessment: post-procedure vital signs reviewed and stable Respiratory status: spontaneous breathing, nonlabored ventilation and respiratory function stable Cardiovascular status: stable Postop Assessment: no headache, no backache and epidural receding Anesthetic complications: no    Last Vitals:  Vitals:   01/17/18 2043 01/17/18 2208  BP: 121/73 114/73  Pulse: 73 98  Resp: 18 18  Temp: 36.8 C 37 C  SpO2:  98%    Last Pain:  Vitals:   01/17/18 2043  TempSrc: Oral  PainSc:    Pain Goal:                 [name]

## 2018-01-19 LAB — BIRTH TISSUE RECOVERY COLLECTION (PLACENTA DONATION)

## 2018-01-19 MED ORDER — IBUPROFEN 600 MG PO TABS: 600.0000 mg | ORAL_TABLET | Freq: Four times a day (QID) | ORAL | 0 refills | Status: DC | PRN

## 2018-01-19 MED ORDER — ACETAMINOPHEN 325 MG PO TABS: 650.0000 mg | ORAL_TABLET | ORAL | Status: DC | PRN

## 2018-01-19 NOTE — Lactation Note (Signed)
This note was copied from a baby's chart. Lactation Consultation Note  Patient Name: Sherry Houston NUUVO'ZToday's Date: 01/19/2018 Reason for consult: Follow-up assessment;Term  Spanish interpreter [phone] for interpretation  Baby sleeping in bassinet when I arrived and family present.  Mother has no questions/concerns at this time.  Her breasts are soft and non tender and nipples are slightly reddened and irritated.  Informed mother to use EBM and then follow up with coconut oil for lubrication and comfort.  RN has given coconut oil.    Encouraged to continue feeding baby 8-12 times/24 hours or sooner if he shows feeding cues.  Reminded mother to wait for a wide open mouth before latching and to keep baby in tight to the breast tissue.  Mother verbalized understanding via interpreter.  Mother does not work outside the home and does not need a DEBP at this time.  Manual pump with instructions for use provided.  #24 flange size changed to a #27 with more comfort for mother.  Engorgement prevention/treatment reviewed.  Mother is waiting to be discharged today.      Feeding Feeding Type: Bottle Fed - Formula Nipple Type: Slow - flow Length of feed: 20 min  LATCH Score                   Interventions    Lactation Tools Discussed/Used Pump Review: Setup, frequency, and cleaning;Milk Storage Initiated by:: Sherry Houston Date initiated:: 01/19/18   Consult Status Consult Status: Complete Date: 01/19/18 Follow-up type: Call as needed    Sherry Houston 01/19/2018, 11:59 AM

## 2018-01-19 NOTE — Discharge Summary (Signed)
Obstetrical Discharge Summary  Date of Admission: 01/17/2018 Date of Discharge: 01/19/2018  Primary OB: [facility]  Gestational Age at Delivery: 5641w1d   Antepartum complications: FVL heterozygote on ppx anticoagulation this pregnancy. Reason for Admission: IOL for on anticoagulation Date of Delivery: 01/17/18  Delivered By: Judie PetitM. Zerita Boersarlene Lawson CNM Delivery Type: VAVD Intrapartum complications/course: None Anesthesia: epidural Placenta: Delivered and expressed via active management. Intact: yes. To pathology: yes.  Laceration: 2nd degree Episiotomy: none EBL: 400mL Baby: Liveborn female, APGARs 9/9, weight 3714 g.    Discharge Diagnosis: Delivered.  Postpartum course: Uncomplicated. She was restarted on her ppx lovenox and told with the interpreter to continue this for 6wks  Discharge Vital Signs:  Current Vital Signs 24h Vital Sign Ranges  T 98.1 F (36.7 C) Temp  Avg: 98.3 F (36.8 C)  Min: 98.1 F (36.7 C)  Max: 98.5 F (36.9 C)  BP (!) 94/57 BP  Min: 94/57  Max: 95/56  HR 79 Pulse  Avg: 77.5  Min: 76  Max: 79  RR 18 Resp  Avg: 18  Min: 18  Max: 18  SaO2 97 % (Room Air) SpO2  Avg: 95 %  Min: 93 %  Max: 97 %       24 Hour I/O Current Shift I/O  Time Ins Outs No intake/output data recorded. No intake/output data recorded.   Discharge Exam:  NAD Perineum: deferred Abdomen: firm fundus below the umbilicus, NTTP, non distended, +bowel sounds.  RRR no MRGs CTAB Ext: no c/c/e  Recent Labs  Lab 01/17/18 0818  WBC 8.5  HGB 11.3*  HCT 35.2*  PLT 235    Disposition: Home  Rh Immune globulin given: not applicable Rubella vaccine given: not applicable Tdap vaccine given in AP or PP setting: yes  Contraception: undecided  Prenatal/Postnatal Panel: A POS Performed at [facility], [address] [place], [place] [address] Ishmael Holter//Rubella Immune//RPR negative//HIV negative/HepB Surface Ag negative//pap no abnormalities (date: 2019)//plans to  breastfeed  Plan:  Rachael FeeEsther Solis Houston was discharged to home in good condition. Follow-up appointment with [facility] in 4-6 weeks for a PP visit  Future Appointments  Date Time Provider Department Center  03/02/2018 10:55 AM Armando ReichertHogan, Heather D, CNM [facility]    Discharge Medications: Allergies as of 01/19/2018   No Known Allergies     Medication List    TAKE these medications   acetaminophen 325 MG tablet Commonly known as:  TYLENOL Take 2 tablets (650 mg total) by mouth every 4 (four) hours as needed (for pain scale < 4).   enoxaparin 40 MG/0.4ML injection Commonly known as:  LOVENOX Inject 40 mg into the skin daily.   ibuprofen 600 MG tablet Commonly known as:  ADVIL,MOTRIN Take 1 tablet (600 mg total) by mouth every 6 (six) hours as needed for mild pain or cramping.   prenatal vitamin w/FE, FA 27-1 MG Tabs tablet Take 1 tablet by mouth daily at 12 noon.       Sherry Houston, Jr. MD Attending Center for [facility](Faculty Practice)

## 2018-01-19 NOTE — Discharge Instructions (Signed)
Continue your lovenox for 6 weeks    Vaginal Delivery, Care After Refer to this sheet in the next few weeks. These discharge instructions provide you with information on caring for yourself after delivery. Your caregiver may also give you specific instructions. Your treatment has been planned according to the most current medical practices available, but problems sometimes occur. Call your caregiver if you have any problems or questions after you go home. HOME CARE INSTRUCTIONS 1. Take over-the-counter or prescription medicines only as directed by your caregiver or pharmacist. 2. Do not drink alcohol, especially if you are breastfeeding or taking medicine to relieve pain. 3. Do not smoke tobacco. 4. Continue to use good perineal care. Good perineal care includes: 1. Wiping your perineum from back to front 2. Keeping your perineum clean. 3. You can do sitz baths twice a day, to help keep this area clean 5. Do not use tampons, douche or have sex until your caregiver says it is okay. 6. Shower only and avoid sitting in submerged water, aside from sitz baths 7. Wear a well-fitting bra that provides breast support. 8. Eat healthy foods. 9. Drink enough fluids to keep your urine clear or pale yellow. 10. Eat high-fiber foods such as whole grain cereals and breads, brown rice, beans, and fresh fruits and vegetables every day. These foods may help prevent or relieve constipation. 11. Avoid constipation with high fiber foods or medications, such as miralax or metamucil 12. Follow your caregiver's recommendations regarding resumption of activities such as climbing stairs, driving, lifting, exercising, or traveling. 13. Talk to your caregiver about resuming sexual activities. Resumption of sexual activities is dependent upon your risk of infection, your rate of healing, and your comfort and desire to resume sexual activity. 14. Try to have someone help you with your household activities and your newborn  for at least a few days after you leave the hospital. 15. Rest as much as possible. Try to rest or take a nap when your newborn is sleeping. 16. Increase your activities gradually. 17. Keep all of your scheduled postpartum appointments. It is very important to keep your scheduled follow-up appointments. At these appointments, your caregiver will be checking to make sure that you are healing physically and emotionally. SEEK MEDICAL CARE IF:   You are passing large clots from your vagina. Save any clots to show your caregiver.  You have a foul smelling discharge from your vagina.  You have trouble urinating.  You are urinating frequently.  You have pain when you urinate.  You have a change in your bowel movements.  You have increasing redness, pain, or swelling near your vaginal incision (episiotomy) or vaginal tear.  You have pus draining from your episiotomy or vaginal tear.  Your episiotomy or vaginal tear is separating.  You have painful, hard, or reddened breasts.  You have a severe headache.  You have blurred vision or see spots.  You feel sad or depressed.  You have thoughts of hurting yourself or your newborn.  You have questions about your care, the care of your newborn, or medicines.  You are dizzy or light-headed.  You have a rash.  You have nausea or vomiting.  You were breastfeeding and have not had a menstrual period within 12 weeks after you stopped breastfeeding.  You are not breastfeeding and have not had a menstrual period by the 12th week after delivery.  You have a fever. SEEK IMMEDIATE MEDICAL CARE IF:   You have persistent pain.  You have  chest pain.  You have shortness of breath.  You faint.  You have leg pain.  You have stomach pain.  Your vaginal bleeding saturates two or more sanitary pads in 1 hour. MAKE SURE YOU:   Understand these instructions.  Will watch your condition.  Will get help right away if you are not doing well  or get worse. Document Released: 05/16/2000 Document Revised: 10/03/2013 Document Reviewed: 01/14/2012 [facility] Patient Information 2015 [place]Care, [place]LL[place]. This information is not intended to replace advice given to you by your health care provider. Make sure you discuss any questions you have with your health care provider.  Sitz Bath A sitz bath is a warm water bath taken in the sitting position. The water covers only the hips and butt (buttocks). We recommend using one that fits in the toilet, to help with ease of use and cleanliness. It may be used for either healing or cleaning purposes. Sitz baths are also used to relieve pain, itching, or muscle tightening (spasms). The water may contain medicine. Moist heat will help you heal and relax.  HOME CARE  Take 3 to 4 sitz baths a day. 18. Fill the bathtub half-full with warm water. 19. Sit in the water and open the drain a little. 20. Turn on the warm water to keep the tub half-full. Keep the water running constantly. 21. Soak in the water for 15 to 20 minutes. 22. After the sitz bath, pat the affected area dry. GET HELP RIGHT AWAY IF: You get worse instead of better. Stop the sitz baths if you get worse. MAKE SURE YOU:  Understand these instructions.  Will watch your condition.  Will get help right away if you are not doing well or get worse. Document Released: 06/26/2004 Document Revised: 02/11/2012 Document Reviewed: 09/16/2010 [facility] Patient Information 2015 [place]ExitCare, MarylandLLC. This information is not intended to replace advice given to you by your health care provider. Make sure you discuss any questions you have with your health care provider.

## 2018-03-02 ENCOUNTER — Ambulatory Visit (INDEPENDENT_AMBULATORY_CARE_PROVIDER_SITE_OTHER): Payer: Medicaid Other | Admitting: Advanced Practice Midwife

## 2018-03-02 ENCOUNTER — Encounter: Payer: Self-pay | Admitting: Advanced Practice Midwife

## 2018-03-02 DIAGNOSIS — Z1389 Encounter for screening for other disorder: Secondary | ICD-10-CM

## 2018-03-02 MED ORDER — HYDROCORTISONE ACETATE 25 MG RE SUPP: 25.0000 mg | Freq: Two times a day (BID) | RECTAL | 2 refills | Status: DC

## 2018-03-02 NOTE — Progress Notes (Signed)
Subjective:     Sherry Houston is a 32 y.o. female G2P2002 who presents for a postpartum visit. She is 6 weeks postpartum following a spontaneous vaginal delivery. I have fully reviewed the prenatal and intrapartum course. The delivery was at 39.1 gestational weeks. Outcome: spontaneous vaginal delivery. Anesthesia: epidural. Postpartum course has been unremarkable. Baby's course has been unremarkable. Baby is feeding by both breast and bottle - Daron Offer . Bleeding no bleeding. Bowel function is normal. Bladder function is normal. Patient is not sexually active. Contraception method is condoms. Postpartum depression screening: negative.  The following portions of the patient's history were reviewed and updated as appropriate: allergies, current medications, past family history, past medical history, past social history, past surgical history and problem list.  Patient is still using lovenox at this time.   Review of Systems Pertinent items are noted in HPI.   Objective:    BP 112/66   Pulse 73   Wt 131 lb (59.4 kg)   BMI 23.96 kg/m    Physical Exam  Constitutional: She is oriented to person, place, and time. She appears well-developed and well-nourished. No distress.  HENT:  Head: Normocephalic.  Cardiovascular: Normal rate.  Pulmonary/Chest: Effort normal.  Abdominal: Soft.  Neurological: She is alert and oriented to person, place, and time.  Skin: Skin is warm and dry.  Psychiatric: She has a normal mood and affect.  Nursing note and vitals reviewed.   Assessment:     Routine postpartum exam. Pap smear not done at today's visit.   Plan:    1. Contraception: condoms 2. Routine care 3. Follow up in: 1 year or as needed.   4. Consult with Dr. Vergie Living. Ok to stop lovenex after 6 weeks full weeks from delivery.

## 2022-03-19 ENCOUNTER — Ambulatory Visit (INDEPENDENT_AMBULATORY_CARE_PROVIDER_SITE_OTHER): Payer: Commercial Managed Care - HMO | Admitting: Emergency Medicine

## 2022-03-19 ENCOUNTER — Encounter: Payer: Self-pay | Admitting: Emergency Medicine

## 2022-03-19 VITALS — BP 120/62 | HR 99 | Temp 98.4°F | Ht 62.0 in | Wt 139.4 lb

## 2022-03-19 DIAGNOSIS — Z13220 Encounter for screening for lipoid disorders: Secondary | ICD-10-CM

## 2022-03-19 DIAGNOSIS — Z1329 Encounter for screening for other suspected endocrine disorder: Secondary | ICD-10-CM

## 2022-03-19 DIAGNOSIS — Z23 Encounter for immunization: Secondary | ICD-10-CM | POA: Diagnosis not present

## 2022-03-19 DIAGNOSIS — Z130 Encounter for screening for diseases of the blood and blood-forming organs and certain disorders involving the immune mechanism: Secondary | ICD-10-CM

## 2022-03-19 DIAGNOSIS — Z7689 Persons encountering health services in other specified circumstances: Secondary | ICD-10-CM

## 2022-03-19 DIAGNOSIS — Z1159 Encounter for screening for other viral diseases: Secondary | ICD-10-CM | POA: Diagnosis not present

## 2022-03-19 DIAGNOSIS — Z0000 Encounter for general adult medical examination without abnormal findings: Secondary | ICD-10-CM | POA: Diagnosis not present

## 2022-03-19 DIAGNOSIS — Z13228 Encounter for screening for other metabolic disorders: Secondary | ICD-10-CM | POA: Diagnosis not present

## 2022-03-19 LAB — CBC WITH DIFFERENTIAL/PLATELET
Basophils Absolute: 0 10*3/uL (ref 0.0–0.1)
Basophils Relative: 0.5 % (ref 0.0–3.0)
Eosinophils Absolute: 0 10*3/uL (ref 0.0–0.7)
Eosinophils Relative: 0.3 % (ref 0.0–5.0)
HCT: 40.8 % (ref 36.0–46.0)
Hemoglobin: 13.9 g/dL (ref 12.0–15.0)
Lymphocytes Relative: 9.8 % — ABNORMAL LOW (ref 12.0–46.0)
Lymphs Abs: 0.9 10*3/uL (ref 0.7–4.0)
MCHC: 34.1 g/dL (ref 30.0–36.0)
MCV: 85 fl (ref 78.0–100.0)
Monocytes Absolute: 0.5 10*3/uL (ref 0.1–1.0)
Monocytes Relative: 5.1 % (ref 3.0–12.0)
Neutro Abs: 7.6 10*3/uL (ref 1.4–7.7)
Neutrophils Relative %: 84.3 % — ABNORMAL HIGH (ref 43.0–77.0)
Platelets: 245 10*3/uL (ref 150.0–400.0)
RBC: 4.8 Mil/uL (ref 3.87–5.11)
RDW: 14.4 % (ref 11.5–15.5)
WBC: 9 10*3/uL (ref 4.0–10.5)

## 2022-03-19 LAB — LIPID PANEL
Cholesterol: 149 mg/dL (ref 0–200)
HDL: 47.5 mg/dL (ref >39.00)
LDL Cholesterol: 87 mg/dL (ref 0–99)
NonHDL: 101.6
Total CHOL/HDL Ratio: 3
Triglycerides: 74 mg/dL (ref 0.0–149.0)
VLDL: 14.8 mg/dL (ref 0.0–40.0)

## 2022-03-19 LAB — COMPREHENSIVE METABOLIC PANEL
ALT: 16 U/L (ref 0–35)
AST: 14 U/L (ref 0–37)
Albumin: 4.8 g/dL (ref 3.5–5.2)
Alkaline Phosphatase: 49 U/L (ref 39–117)
BUN: 14 mg/dL (ref 6–23)
CO2: 29 mEq/L (ref 19–32)
Calcium: 9.8 mg/dL (ref 8.4–10.5)
Chloride: 102 mEq/L (ref 96–112)
Creatinine, Ser: 0.69 mg/dL (ref 0.40–1.20)
GFR: 112.95 mL/min (ref >60.00)
Glucose, Bld: 109 mg/dL — ABNORMAL HIGH (ref 70–99)
Potassium: 3.7 mEq/L (ref 3.5–5.1)
Sodium: 137 mEq/L (ref 135–145)
Total Bilirubin: 0.4 mg/dL (ref 0.2–1.2)
Total Protein: 8.1 g/dL (ref 6.0–8.3)

## 2022-03-19 LAB — HEMOGLOBIN A1C: Hgb A1c MFr Bld: 5.4 % (ref 4.6–6.5)

## 2022-03-19 NOTE — Patient Instructions (Signed)

## 2022-03-19 NOTE — Progress Notes (Signed)
Sherry Houston 36 y.o.   Chief Complaint  Patient presents with   New Patient (Initial Visit)    HISTORY OF PRESENT ILLNESS: This is a 36 y.o. female first visit to this office, here to establish care with me. History of DVT during pregnancy Factor V mutation History of varicose veins. Healthy lifestyle.  Mother of 2. No other complaints or medical concerns today.  HPI   Prior to Admission medications   Not on File    No Known Allergies  Patient Active Problem List   Diagnosis Date Noted   Factor V Leiden mutation affecting pregnancy (HCC) 01/17/2018   Factor 5 Leiden mutation, heterozygous (HCC) 09/16/2017    Past Medical History:  Diagnosis Date   Factor 5 Leiden mutation, heterozygous (HCC) 09/16/2017   Obstetrical blood clot embolism in second trimester 2014   LLE. Diagnosed in first pregnancy    Past Surgical History:  Procedure Laterality Date   NO PAST SURGERIES      Social History   Socioeconomic History   Marital status: Married    Spouse name: Not on file   Number of children: Not on file   Years of education: Not on file   Highest education level: Not on file  Occupational History   Not on file  Tobacco Use   Smoking status: Never   Smokeless tobacco: Never  Vaping Use   Vaping Use: Never used  Substance and Sexual Activity   Alcohol use: No    Alcohol/week: 0.0 standard drinks of alcohol   Drug use: No   Sexual activity: Yes    Birth control/protection: None    Comment: last sex three days  Other Topics Concern   Not on file  Social History Narrative   ** Merged History Encounter **       Social Determinants of Health   Financial Resource Strain: Low Risk  (01/07/2018)   Overall Financial Resource Strain (CARDIA)    Difficulty of Paying Living Expenses: Not hard at all  Food Insecurity: No Food Insecurity (01/07/2018)   Hunger Vital Sign    Worried About Running Out of Food in the Last Year: Never true    Ran Out of Food in  the Last Year: Never true  Transportation Needs: No Transportation Needs (01/07/2018)   PRAPARE - Administrator, Civil Service (Medical): No    Lack of Transportation (Non-Medical): No  Physical Activity: Not on file  Stress: Not on file  Social Connections: Not on file  Intimate Partner Violence: Not At Risk (01/07/2018)   Humiliation, Afraid, Rape, and Kick questionnaire    Fear of Current or Ex-Partner: No    Emotionally Abused: No    Physically Abused: No    Sexually Abused: No    Family History  Problem Relation Age of Onset   Heart failure Sister      Review of Systems  Constitutional: Negative.  Negative for chills and fever.  HENT: Negative.    Eyes: Negative.   Cardiovascular: Negative.   Gastrointestinal: Negative.  Negative for abdominal pain, nausea and vomiting.  Genitourinary: Negative.   Musculoskeletal: Negative.   Skin: Negative.   Neurological: Negative.  Negative for dizziness and headaches.  All other systems reviewed and are negative.  Today's Vitals   03/19/22 1305  BP: 120/62  Pulse: 99  Temp: 98.4 F (36.9 C)  TempSrc: Oral  SpO2: 94%  Weight: 139 lb 6 oz (63.2 kg)  Height: 5\' 2"  (1.575 m)  Body mass index is 25.49 kg/m.   Physical Exam Vitals reviewed.  Constitutional:      Appearance: Normal appearance.  HENT:     Head: Normocephalic.     Right Ear: Tympanic membrane, ear canal and external ear normal.     Left Ear: Tympanic membrane, ear canal and external ear normal.     Mouth/Throat:     Mouth: Mucous membranes are moist.     Pharynx: Oropharynx is clear.  Eyes:     Extraocular Movements: Extraocular movements intact.     Conjunctiva/sclera: Conjunctivae normal.     Pupils: Pupils are equal, round, and reactive to light.  Cardiovascular:     Rate and Rhythm: Normal rate and regular rhythm.     Pulses: Normal pulses.     Heart sounds: Normal heart sounds.  Pulmonary:     Effort: Pulmonary effort is normal.      Breath sounds: Normal breath sounds.  Abdominal:     Palpations: Abdomen is soft.     Tenderness: There is no abdominal tenderness.  Musculoskeletal:     Cervical back: No tenderness.     Right lower leg: No edema.     Left lower leg: No edema.  Lymphadenopathy:     Cervical: No cervical adenopathy.  Skin:    General: Skin is warm and dry.     Comments: Varicose veins to lower extremities right more than left  Neurological:     General: No focal deficit present.     Mental Status: She is alert and oriented to person, place, and time.  Psychiatric:        Mood and Affect: Mood normal.        Behavior: Behavior normal.      ASSESSMENT & PLAN: Problem List Items Addressed This Visit   None Visit Diagnoses     Routine general medical examination at a health care facility    -  Primary   Relevant Orders   CBC with Differential   Comprehensive metabolic panel   Hemoglobin A1c   Lipid panel   Ambulatory referral to Gynecology   Need for vaccination       Relevant Orders   Flu Vaccine QUAD 6+ mos PF IM (Fluarix Quad PF)   Need for hepatitis C screening test       Relevant Orders   Hepatitis C antibody screen   Screening for deficiency anemia       Relevant Orders   CBC with Differential   Screening for lipoid disorders       Relevant Orders   Lipid panel   Screening for endocrine, metabolic and immunity disorder       Relevant Orders   Comprehensive metabolic panel   Hemoglobin A1c   Encounter to establish care          Modifiable risk factors discussed with patient. Anticipatory guidance according to age provided. The following topics were also discussed: Social Determinants of Health Smoking.  Non-smoker Diet and nutrition.  Good nutritional habits Benefits of exercise Cancer family history review Vaccinations reviewed and recommendations Cardiovascular risk assessment and need for blood work Mental health including depression and anxiety Fall and accident  prevention  Patient Instructions  Mantenimiento de Technical sales engineer en [place] Maintenance, Female Adoptar un estilo de vida saludable y recibir atencin preventiva son importantes para promover la salud y Musician. Consulte al mdico sobre: El esquema adecuado para hacerse pruebas y exmenes peridicos. Cosas que Fish farm manager por  su cuenta para prevenir enfermedades y [employer]. Qu debo saber sobre la dieta, el peso y el ejercicio? Consuma una dieta saludable  Consuma una dieta que incluya muchas verduras, frutas, productos lcteos con bajo contenido de [place] y Associate Professor. No consuma muchos alimentos ricos en grasas slidas, azcares agregados o sodio. Mantenga un peso saludable El ndice de masa muscular [facility]) se [place] para identificar problemas de [place]. Proporciona una estimacin de la grasa corporal basndose en el peso y la altura. Su mdico puede ayudarle a Engineer, site IMC y a Personnel officer o Pharmacologist un peso saludable. Haga ejercicio con regularidad Haga ejercicio con regularidad. Esta es una de las prcticas ms importantes que puede hacer por su salud. La [employer] de los adultos deben seguir estas pautas: Education officer, environmental, al menos, 150 minutos de actividad fsica por semana. El ejercicio debe aumentar la frecuencia cardaca y Media planner transpirar (ejercicio de intensidad moderada). Hacer ejercicios de fortalecimiento por lo [employer] por semana. Agregue esto a su plan de ejercicio de intensidad moderada. Pase menos tiempo sentada. Incluso la actividad fsica ligera puede ser beneficiosa. Controle sus niveles de colesterol y lpidos en la sangre Comience a realizarse anlisis de lpidos y Oncologist en la sangre a los 20 aos y luego reptalos cada 5 aos. Hgase controlar los niveles de colesterol con mayor frecuencia si: Sus niveles de lpidos y colesterol son altos. Es mayor de 40 aos. Presenta un alto riesgo de padecer enfermedades cardacas. Qu debo saber sobre  las pruebas de deteccin del cncer? Segn su historia clnica y sus antecedentes familiares, es posible que deba realizarse pruebas de deteccin del cncer en diferentes edades. Esto puede incluir pruebas de deteccin de lo siguiente: Cncer de mama. Cncer de cuello uterino. Cncer colorrectal. Cncer de piel. Cncer de pulmn. Qu debo saber sobre la enfermedad cardaca, la diabetes y la hipertensin arterial? Presin arterial y enfermedad cardaca La hipertensin arterial causa enfermedades cardacas y [place] el riesgo de accidente cerebrovascular. Es ms probable que esto se manifieste en las personas que tienen lecturas de presin arterial alta o tienen sobrepeso. Hgase controlar la presin arterial: Cada 3 a 5 aos si tiene entre 18 y 63 aos. Todos los aos si es mayor de 40 aos. Diabetes Realcese exmenes de deteccin de la diabetes con regularidad. Este anlisis revisa el nivel de azcar en la sangre en [place]. Hgase las pruebas de deteccin: Cada tres aos despus de los 40 aos de edad si tiene un peso normal y un bajo riesgo de padecer diabetes. Con ms frecuencia y a partir de [place] edad inferior si tiene sobrepeso o un alto riesgo de padecer diabetes. Qu debo saber sobre la prevencin de infecciones? Hepatitis B Si tiene un riesgo ms alto de contraer hepatitis B, debe someterse a un examen de deteccin de este virus. Hable con el mdico para averiguar si tiene riesgo de contraer la infeccin por hepatitis B. Hepatitis C Se recomienda el anlisis a: [employer] 1945 y 1965. Todas las personas que tengan un riesgo de haber contrado hepatitis C. Enfermedades de transmisin sexual (ETS) Hgase las pruebas de Airline pilot de ITS, incluidas la gonorrea y la clamidia, si: Es sexualmente activa y es menor de [address]. Es mayor de [address], y Public affairs consultant informa que corre riesgo de tener este tipo de infecciones. La actividad sexual ha cambiado desde que le  hicieron la ltima prueba de deteccin y tiene un riesgo mayor de Warehouse manager clamidia o Copy. Pregntele al  mdico si usted tiene riesgo. Pregntele al mdico si usted tiene un alto riesgo de Primary school teacher VIH. El mdico tambin puede recomendarle un medicamento recetado para ayudar a evitar la infeccin por el VIH. Si elige tomar medicamentos para prevenir el VIH, primero debe [employer] de deteccin del VIH. Luego debe hacerse anlisis cada 3 meses mientras est tomando los medicamentos. Embarazo Si est por dejar de Armed forces training and education officer (fase premenopusica) y usted puede quedar [place], busque asesoramiento antes de [place]. [place] de 400 a 800 microgramos (mcg) de cido [employer] si [place]. Pida mtodos de control de la natalidad (anticonceptivos) si desea evitar un embarazo no deseado. Osteoporosis y [place] La osteoporosis es una enfermedad en la que los huesos pierden los minerales y la fuerza por el avance de la edad. El resultado pueden ser fracturas en los [place]. Si tiene 65 aos o ms, o si est en riesgo de sufrir osteoporosis y fracturas, pregunte a su mdico si debe: Hacerse pruebas de deteccin de prdida sea. Tomar un suplemento de calcio o de vitamina D para reducir el riesgo de fracturas. Recibir terapia de reemplazo hormonal (TRH) para tratar los sntomas de la menopausia. Siga estas indicaciones en su casa: Consumo de alcohol No beba alcohol si: Su mdico le indica no hacerlo. Est embarazada, puede estar embarazada o est tratando de [place]. Si bebe alcohol: Limite la cantidad que bebe a lo siguiente: De 0 a 1 bebida por da. Sepa cunta cantidad de alcohol hay en las bebidas que toma. En los [address], una medida equivale a una botella de cerveza de 12 oz (355 ml), un vaso de vino de 5 oz (148 ml) o un vaso de una bebida alcohlica de alta graduacin de 1 oz (44 ml). Estilo de vida No consuma ningn producto que contenga  nicotina o tabaco. Estos productos incluyen cigarrillos, tabaco para Theatre manager y aparatos de vapeo, como los Administrator, Civil Service. Si necesita ayuda para dejar de consumir estos productos, consulte al mdico. No consuma drogas. No comparta agujas. Solicite ayuda a su mdico si necesita apoyo o informacin para abandonar las drogas. Indicaciones generales Realcese los estudios de rutina de [address], dentales y de Wellsite geologist. Mantngase al da con las vacunas. Infrmele a su mdico si: Se siente deprimida con frecuencia. Alguna vez ha sido vctima de [place] o no se siente seguro en su casa. Resumen Adoptar un estilo de vida saludable y recibir atencin preventiva son importantes para promover la salud y Counsellor. Siga las instrucciones del mdico acerca de una dieta saludable, el ejercicio y la realizacin de pruebas o exmenes para Hotel manager. Siga las instrucciones del mdico con respecto al control del colesterol y la presin arterial. Esta informacin no tiene Theme park manager el consejo del mdico. Asegrese de hacerle al mdico cualquier pregunta que tenga. Document Revised: 10/25/2020 Document Reviewed: 10/25/2020 Elsevier Patient Education  2023 Elsevier Inc.     Edwina Barth, MD [facility]

## 2022-03-20 LAB — HEPATITIS C ANTIBODY: Hepatitis C Ab: NONREACTIVE

## 2022-04-07 ENCOUNTER — Encounter: Payer: Self-pay | Admitting: Emergency Medicine

## 2022-05-22 ENCOUNTER — Other Ambulatory Visit (HOSPITAL_COMMUNITY)
Admission: RE | Admit: 2022-05-22 | Discharge: 2022-05-22 | Disposition: A | Discharge status: Home / Self Care | Payer: Commercial Managed Care - HMO | Source: Ambulatory Visit | Attending: Obstetrics and Gynecology | Admitting: Obstetrics and Gynecology

## 2022-05-22 ENCOUNTER — Ambulatory Visit (INDEPENDENT_AMBULATORY_CARE_PROVIDER_SITE_OTHER): Payer: Commercial Managed Care - HMO | Admitting: Obstetrics and Gynecology

## 2022-05-22 ENCOUNTER — Encounter: Payer: Self-pay | Admitting: Obstetrics and Gynecology

## 2022-05-22 VITALS — BP 125/75 | HR 83 | Wt 141.2 lb

## 2022-05-22 DIAGNOSIS — Z124 Encounter for screening for malignant neoplasm of cervix: Secondary | ICD-10-CM

## 2022-05-22 DIAGNOSIS — N898 Other specified noninflammatory disorders of vagina: Secondary | ICD-10-CM | POA: Diagnosis not present

## 2022-05-22 DIAGNOSIS — Z01419 Encounter for gynecological examination (general) (routine) without abnormal findings: Secondary | ICD-10-CM | POA: Insufficient documentation

## 2022-05-22 NOTE — Progress Notes (Signed)
ANNUAL EXAM Patient name: Sherry Houston MRN [id number]  Date of birth: Jan 15, 1986 Chief Complaint:   Gynecologic Exam  History of Present Illness:   Sherry Houston is a 36 y.o. [phone] with Patient's last menstrual period was 05/16/2022. being seen today for a routine annual exam.   Interviewed with in person medical spanish interpreter  Current complaints:  Moles  Hemorrhoids Vaginal discharge   Upstream - 05/22/22 1141       Pregnancy Intention Screening   Does the patient want to become pregnant in the next year? No    Does the patient's partner want to become pregnant in the next year? No    Would the patient like to discuss contraceptive options today? No            The pregnancy intention screening data noted above was reviewed. Potential methods of contraception were discussed. The patient elected to proceed with No data recorded.   Last pap 2019. Results were:  NILM . H/O abnormal pap: no Last mammogram: n/a. Family h/o breast cancer: no Last colonoscopy: n/a. Family h/o colorectal cancer: no     05/22/2022   11:10 AM 03/19/2022    1:09 PM 12/23/2017    3:28 PM 12/07/2017   10:54 AM 11/23/2017    2:33 PM  Depression screen PHQ 2/9  Decreased Interest 0 0 1 1 1   Down, Depressed, Hopeless 0 0 0 0 0  PHQ - 2 Score 0 0 1 1 1   Altered sleeping 0  1 1 1   Tired, decreased energy 0  1 1 1   Change in appetite 0  0 0 0  Feeling bad or failure about yourself  0  0 0 0  Trouble concentrating 0  0 0 0  Moving slowly or fidgety/restless 0  0 0 0  Suicidal thoughts 0  0 0 0  PHQ-9 Score 0  3 3 3       05/22/2022   11:10 AM 12/23/2017    3:29 PM 12/07/2017   10:55 AM 11/23/2017    2:33 PM  GAD 7 : Generalized Anxiety Score  Nervous, Anxious, on Edge 0 1 0 0  Control/stop worrying 0 0 0 0  Worry too much - different things 0 0 1 1  Trouble relaxing 0 1 0 0  Restless 0 0 0 1  Easily annoyed or irritable 0 0 0 0  Afraid - awful might happen 0 1 1 1   Total  GAD 7 Score 0 3 2 3    Review of Systems:   Pertinent items are noted in HPI Denies any headaches, blurred vision, fatigue, shortness of breath, chest pain, abdominal pain, abnormal vaginal discharge/itching/odor/irritation, problems with periods, bowel movements, urination, or intercourse unless otherwise stated above. Pertinent History Reviewed:  Reviewed past medical,surgical, social and family history.  Reviewed problem list, medications and allergies. Physical Assessment:   Vitals:   05/22/22 1101  BP: 125/75  Pulse: 83  Weight: 141 lb 3.2 oz (64 kg)  Body mass index is 25.83 kg/m.        Physical Examination:   General appearance - well appearing, and in no distress  Mental status - alert, oriented to person, place, and time  Chest - respiratory effort normal  Heart - normal peripheral perfusion  Breasts - breasts appear normal, no suspicious masses, no skin or nipple changes or axillary nodes  Abdomen - soft, nontender, nondistended, no masses or organomegaly  Pelvic - VULVA: normal appearing vulva with no  masses, tenderness or lesions  VAGINA: normal appearing vagina with normal color and discharge, no lesions  CERVIX: normal appearing cervix without discharge or lesions, no CMT  Thin prep pap is done with HR HPV cotesting  Chaperone present for exam  Assessment & Plan:  1) Well-Woman Exam Mammogram: @ 34yo, or sooner if problems Colonoscopy: @ 34yo, or sooner if problems Pap collected GC/CT collected HIV/HCV: negative 2019 & 2023 respectively  2) Vaginal discharge - Swab collected  Follow-up: Return in about 1 year (around 05/23/2023).  Lennart Pall, MD 05/22/2022 12:00 PM

## 2022-05-22 NOTE — Progress Notes (Signed)
Pt presents for AEX. Last PAP 06/2019 at [facility]. Pt requesting PAP smear and STD testing today. Pt c/o of the changes in discharge after periods. Denies excessive discharge, odor and signs of infection.

## 2022-05-22 NOTE — Patient Instructions (Signed)
Hemorrhoids:  Anusol  Anusol HC  Preparation H  Tucks      

## 2022-05-23 LAB — CERVICOVAGINAL ANCILLARY ONLY
Bacterial Vaginitis (gardnerella): NEGATIVE
Candida Glabrata: NEGATIVE
Candida Vaginitis: NEGATIVE
Chlamydia: NEGATIVE
Comment: NEGATIVE
Comment: NEGATIVE
Comment: NEGATIVE
Comment: NEGATIVE
Comment: NEGATIVE
Comment: NORMAL
Neisseria Gonorrhea: NEGATIVE
Trichomonas: NEGATIVE

## 2022-05-27 LAB — CYTOLOGY - PAP
Comment: NEGATIVE
Diagnosis: NEGATIVE
High risk HPV: NEGATIVE

## 2023-02-26 ENCOUNTER — Other Ambulatory Visit: Payer: Self-pay

## 2023-02-26 ENCOUNTER — Emergency Department (HOSPITAL_COMMUNITY)
Admission: EM | Admit: 2023-02-26 | Discharge: 2023-02-26 | Disposition: A | Discharge status: Home / Self Care | Payer: BLUE CROSS/BLUE SHIELD

## 2023-02-26 ENCOUNTER — Emergency Department (HOSPITAL_COMMUNITY): Payer: BLUE CROSS/BLUE SHIELD

## 2023-02-26 DIAGNOSIS — R519 Headache, unspecified: Secondary | ICD-10-CM | POA: Insufficient documentation

## 2023-02-26 DIAGNOSIS — G43109 Migraine with aura, not intractable, without status migrainosus: Secondary | ICD-10-CM

## 2023-02-26 DIAGNOSIS — D75839 Thrombocytosis, unspecified: Secondary | ICD-10-CM | POA: Insufficient documentation

## 2023-02-26 LAB — CBC
HCT: 46.5 % — ABNORMAL HIGH (ref 36.0–46.0)
Hemoglobin: 15.3 g/dL — ABNORMAL HIGH (ref 12.0–15.0)
MCH: 28.9 pg (ref 26.0–34.0)
MCHC: 32.9 g/dL (ref 30.0–36.0)
MCV: 87.7 fL (ref 80.0–100.0)
Platelets: 278 10*3/uL (ref 150–400)
RBC: 5.3 MIL/uL — ABNORMAL HIGH (ref 3.87–5.11)
RDW: 13.8 % (ref 11.5–15.5)
WBC: 5.1 10*3/uL (ref 4.0–10.5)
nRBC: 0 % (ref 0.0–0.2)

## 2023-02-26 LAB — COMPREHENSIVE METABOLIC PANEL
ALT: 24 U/L (ref 0–44)
AST: 20 U/L (ref 15–41)
Albumin: 4.6 g/dL (ref 3.5–5.0)
Alkaline Phosphatase: 42 U/L (ref 38–126)
Anion gap: 10 (ref 5–15)
BUN: 9 mg/dL (ref 6–20)
CO2: 22 mmol/L (ref 22–32)
Calcium: 9.3 mg/dL (ref 8.9–10.3)
Chloride: 105 mmol/L (ref 98–111)
Creatinine, Ser: 0.9 mg/dL (ref 0.44–1.00)
GFR, Estimated: >60 mL/min (ref >60)
Glucose, Bld: 100 mg/dL — ABNORMAL HIGH (ref 70–99)
Potassium: 4.2 mmol/L (ref 3.5–5.1)
Sodium: 137 mmol/L (ref 135–145)
Total Bilirubin: 0.7 mg/dL (ref 0.3–1.2)
Total Protein: 8.1 g/dL (ref 6.5–8.1)

## 2023-02-26 LAB — DIFFERENTIAL
Abs Immature Granulocytes: 0.01 10*3/uL (ref 0.00–0.07)
Basophils Absolute: 0 10*3/uL (ref 0.0–0.1)
Basophils Relative: 1 %
Eosinophils Absolute: 0 10*3/uL (ref 0.0–0.5)
Eosinophils Relative: 0 %
Immature Granulocytes: 0 %
Lymphocytes Relative: 16 %
Lymphs Abs: 0.8 10*3/uL (ref 0.7–4.0)
Monocytes Absolute: 0.2 10*3/uL (ref 0.1–1.0)
Monocytes Relative: 3 %
Neutro Abs: 4.1 10*3/uL (ref 1.7–7.7)
Neutrophils Relative %: 80 %

## 2023-02-26 LAB — URINALYSIS, ROUTINE W REFLEX MICROSCOPIC
Bilirubin Urine: NEGATIVE
Glucose, UA: NEGATIVE mg/dL
Hgb urine dipstick: NEGATIVE
Ketones, ur: NEGATIVE mg/dL
Leukocytes,Ua: NEGATIVE
Nitrite: NEGATIVE
Protein, ur: NEGATIVE mg/dL
Specific Gravity, Urine: 1.005 (ref 1.005–1.030)
pH: 8 (ref 5.0–8.0)

## 2023-02-26 LAB — I-STAT CHEM 8, ED
BUN: 10 mg/dL (ref 6–20)
Calcium, Ion: 1.09 mmol/L — ABNORMAL LOW (ref 1.15–1.40)
Chloride: 105 mmol/L (ref 98–111)
Creatinine, Ser: 0.9 mg/dL (ref 0.44–1.00)
Glucose, Bld: 98 mg/dL (ref 70–99)
HCT: 46 % (ref 36.0–46.0)
Hemoglobin: 15.6 g/dL — ABNORMAL HIGH (ref 12.0–15.0)
Potassium: 4.1 mmol/L (ref 3.5–5.1)
Sodium: 139 mmol/L (ref 135–145)
TCO2: 21 mmol/L — ABNORMAL LOW (ref 22–32)

## 2023-02-26 LAB — PROTIME-INR
INR: 1 (ref 0.8–1.2)
Prothrombin Time: 13.2 seconds (ref 11.4–15.2)

## 2023-02-26 LAB — APTT: aPTT: 31 seconds (ref 24–36)

## 2023-02-26 LAB — ETHANOL: Alcohol, Ethyl (B): <10 mg/dL (ref <10)

## 2023-02-26 LAB — RAPID URINE DRUG SCREEN, HOSP PERFORMED
Amphetamines: NOT DETECTED
Barbiturates: NOT DETECTED
Benzodiazepines: NOT DETECTED
Cocaine: NOT DETECTED
Opiates: NOT DETECTED
Tetrahydrocannabinol: NOT DETECTED

## 2023-02-26 LAB — HCG, SERUM, QUALITATIVE: Preg, Serum: NEGATIVE

## 2023-02-26 MED ORDER — LACTATED RINGERS IV BOLUS
1000.0000 mL | Freq: Once | INTRAVENOUS | Status: AC
  Administered 2023-02-26: 1000 mL via INTRAVENOUS

## 2023-02-26 MED ORDER — METOCLOPRAMIDE HCL 10 MG PO TABS: 10.0000 mg | ORAL_TABLET | Freq: Four times a day (QID) | ORAL | 0 refills | Status: AC | PRN

## 2023-02-26 MED ORDER — PROCHLORPERAZINE EDISYLATE 10 MG/2ML IJ SOLN
10.0000 mg | Freq: Once | INTRAMUSCULAR | Status: AC
  Administered 2023-02-26: 10 mg via INTRAVENOUS
  Filled 2023-02-26: qty 2

## 2023-02-26 MED ORDER — ACETAMINOPHEN 325 MG PO TABS
650.0000 mg | ORAL_TABLET | Freq: Once | ORAL | Status: AC
  Administered 2023-02-26: 650 mg via ORAL
  Filled 2023-02-26: qty 2

## 2023-02-26 MED ORDER — KETOROLAC TROMETHAMINE 15 MG/ML IJ SOLN
15.0000 mg | Freq: Once | INTRAMUSCULAR | Status: AC
  Administered 2023-02-26: 15 mg via INTRAVENOUS
  Filled 2023-02-26: qty 1

## 2023-02-26 MED ORDER — GADOBUTROL 1 MMOL/ML IV SOLN
6.0000 mL | Freq: Once | INTRAVENOUS | Status: AC | PRN
  Administered 2023-02-26: 6 mL via INTRAVENOUS

## 2023-02-26 MED ORDER — DIPHENHYDRAMINE HCL 50 MG/ML IJ SOLN
25.0000 mg | Freq: Once | INTRAMUSCULAR | Status: AC
  Administered 2023-02-26: 25 mg via INTRAVENOUS
  Filled 2023-02-26: qty 1

## 2023-02-26 NOTE — ED Provider Notes (Signed)
  Physical Exam  BP 104/70   Pulse 83   Temp 98 F (36.7 C) (Oral)   Resp 17   Ht 5\' 2"  (1.575 m)   Wt 64 kg   LMP 02/14/2023   SpO2 100%   BMI 25.81 kg/m   Physical Exam Constitutional:      Appearance: Normal appearance.  HENT:     Head: Normocephalic and atraumatic.     Right Ear: External ear normal.     Left Ear: External ear normal.  Eyes:     Extraocular Movements: Extraocular movements intact.     Conjunctiva/sclera: Conjunctivae normal.     Pupils: Pupils are equal, round, and reactive to light.  Neck:     Comments: No meningismus full range of motion Neurological:     General: No focal deficit present.     Mental Status: She is alert and oriented to person, place, and time. Mental status is at baseline.     Cranial Nerves: No cranial nerve deficit.     Sensory: No sensory deficit.     Motor: No weakness.     Procedures  Procedures  ED Course / MDM   Clinical Course as of 02/27/23 1448  Thu Feb 26, 2023  1543 Labs independently reviewed by myself.  No leukocytosis or leukopenia.  Thrombocytosis noted.  Comprehensive panel with no metabolic derangement.  Normal kidney function.  Tox workup was negative and no completion of her symptoms.  Pregnancy test negative. [TY]  1624 Assumed care from Dr. Maple Hudson.  37 year old female who presents with several months of headache and various neurologic complaints including blurry vision and hand weakness.  Did not have any deficits on initial evaluation by the outgoing provider did have a mild frontal and occipital headache.  Pending MRI at this time.  Is Spanish-speaking only.  Did receive headache medications here in the emergency department. [RP]  2018 Patient reassessed.  MRI without acute abnormality.  On repeat neurologic exam does not have any neurologic deficits.  Reports that her headache has resolved.  He will that she may be having a complicated migraine.  Will have her follow-up with her primary doctor and neurology  for additional evaluation. [RP]    Clinical Course User Index [RP] Rondel Baton, MD [TY] Coral Spikes, DO   Medical Decision Making Amount and/or Complexity of Data Reviewed Labs: ordered. Radiology: ordered.  Risk OTC drugs. Prescription drug management.      Rondel Baton, MD 02/27/23 [id number]

## 2023-02-26 NOTE — ED Triage Notes (Signed)
Pt here with c/o headache and nausea. Pt also having BL hand numbness with more numbness on the left side. Total face numbness with more numbness on left side. The headache and numbness started 4 months ago. Pt doctor recommended CT but she has not had one. LKW four months ago

## 2023-02-26 NOTE — ED Provider Notes (Signed)
[place] EMERGENCY DEPARTMENT AT [facility] Provider Note   CSN: [id number] Arrival date & time: 02/26/23  1207     History  Chief Complaint  Patient presents with   Migraine   Numbness    Sherry Houston is a 37 y.o. female.  This is a otherwise healthy 37 year old female presenting emergency department for headaches and varying neurosymptoms.  Headache started 4 months ago, but resolved after a week or 2.  She then has had increasing frequency of headaches frontal and occipital for the past 3 to 4 weeks.  She had numbness to her entire face 4 days ago and then improved to just left side of face currently resolved and not having any numbness.  She also notes numbness and weakness in her bilateral upper extremities, again seemingly having more findings on left hand.  No aphasia, difficulty speaking throughout episodes.  She did note some blurred/double vision a few weeks ago, but resolved and not having any currently.  No seizures.  She does note decreased appetite weight loss.  No fevers, chills.  No chest pain no shortness of breath.  She currently complains of mild frontal and occipital headache.   Migraine       Home Medications Prior to Admission medications   Not on File      Allergies    Patient has no known allergies.    Review of Systems   Review of Systems  Physical Exam Updated Vital Signs BP 120/81   Pulse 100   Temp 98 F (36.7 C) (Oral)   Resp 16   Ht 5\' 2"  (1.575 m)   Wt 64 kg   LMP 02/14/2023   SpO2 100%   BMI 25.81 kg/m  Physical Exam Vitals reviewed.  Constitutional:      General: She is not in acute distress.    Appearance: She is not ill-appearing or toxic-appearing.  HENT:     Head: Normocephalic and atraumatic.     Nose: Nose normal.     Mouth/Throat:     Mouth: Mucous membranes are moist.  Eyes:     Extraocular Movements: Extraocular movements intact.     Conjunctiva/sclera: Conjunctivae normal.     Pupils: Pupils  are equal, round, and reactive to light.  Cardiovascular:     Rate and Rhythm: Normal rate and regular rhythm.     Pulses: Normal pulses.  Pulmonary:     Effort: Pulmonary effort is normal.     Breath sounds: Normal breath sounds.  Abdominal:     General: Abdomen is flat.     Palpations: Abdomen is soft.  Musculoskeletal:        General: Normal range of motion.     Cervical back: Normal range of motion and neck supple.  Skin:    General: Skin is warm and dry.     Capillary Refill: Capillary refill takes less than 2 seconds.  Neurological:     General: No focal deficit present.     Mental Status: She is alert and oriented to person, place, and time.     Cranial Nerves: No cranial nerve deficit.     Sensory: No sensory deficit.     Motor: No weakness.     Coordination: Coordination normal.  Psychiatric:        Mood and Affect: Mood normal.        Behavior: Behavior normal.     ED Results / Procedures / Treatments   Labs (all labs ordered are listed,  but only abnormal results are displayed) Labs Reviewed  CBC - Abnormal; Notable for the following components:      Result Value   RBC 5.30 (*)    Hemoglobin 15.3 (*)    HCT 46.5 (*)    All other components within normal limits  COMPREHENSIVE METABOLIC PANEL - Abnormal; Notable for the following components:   Glucose, Bld 100 (*)    All other components within normal limits  URINALYSIS, ROUTINE W REFLEX MICROSCOPIC - Abnormal; Notable for the following components:   Color, Urine STRAW (*)    APPearance HAZY (*)    All other components within normal limits  I-STAT CHEM 8, ED - Abnormal; Notable for the following components:   Calcium, Ion 1.09 (*)    TCO2 21 (*)    Hemoglobin 15.6 (*)    All other components within normal limits  ETHANOL  PROTIME-INR  APTT  DIFFERENTIAL  RAPID URINE DRUG SCREEN, HOSP PERFORMED  HCG, SERUM, QUALITATIVE    EKG None  Radiology No results found.  Procedures Procedures     Medications Ordered in ED Medications  acetaminophen (TYLENOL) tablet 650 mg (650 mg Oral Given 02/26/23 1524)  ketorolac (TORADOL) 15 MG/ML injection 15 mg (15 mg Intravenous Given 02/26/23 1526)  prochlorperazine (COMPAZINE) injection 10 mg (10 mg Intravenous Given 02/26/23 1525)  diphenhydrAMINE (BENADRYL) injection 25 mg (25 mg Intravenous Given 02/26/23 1525)  lactated ringers bolus 1,000 mL (1,000 mLs Intravenous New Bag/Given 02/26/23 1524)    ED Course/ Medical Decision Making/ A&P Clinical Course as of 02/26/23 1543  Thu Feb 26, 2023  1543 Labs independently reviewed by myself.  No leukocytosis or leukopenia.  Thrombocytosis noted.  Comprehensive panel with no metabolic derangement.  Normal kidney function.  Tox workup was negative and no completion of her symptoms.  Pregnancy test negative. [TY]    Clinical Course User Index [TY] Coral Spikes, DO                                 Medical Decision Making This is a well-appearing 37 year old female with factor V leiden per chart review presented to the emergency department for headache.  She is afebrile nontachycardic maintain ox saturation on room air.  Complex migraine versus autoimmune versus structural/mass.  Exam today without localizing neurodeficits.  Triage labs independently reviewed by myself and noted in the emergency department.  Will get MRI; care signed out to Dr. Jarold Motto.  Amount and/or Complexity of Data Reviewed Labs: ordered.          Final Clinical Impression(s) / ED Diagnoses Final diagnoses:  None    Rx / DC Orders ED Discharge Orders     None         Coral Spikes, DO 02/26/23 1543

## 2023-02-26 NOTE — Discharge Instructions (Signed)
Le atendieron por su dolor de cabeza en el departamento de emergencias.  Le administraron medicamentos que mejoraron sus sntomas.  En casa, tome Tylenol e ibuprofeno para el dolor de cabeza.  Tambin puede tomar el Reglan que le hemos recetado para el dolor de cabeza o las nuseas o vmitos que tenga.  Puede tomar esto con Benadryl para los dolores de cabeza intensos, Biomedical engineer tenga en cuenta que le provocar somnolencia.    Consulte su MyChart en lnea para conocer los resultados de cualquier prueba que no haya dado resultado cuando sali del departamento de Sports administrator.   Haga un seguimiento con su mdico de atencin primaria en 2-3 das con respecto a su visita.  Seguimiento con neurologa sobre su dolor de [place].  Regrese inmediatamente al departamento de emergencias si experimenta cualquiera de los siguientes sntomas: cambios en la visin, entumecimiento o debilidad de los brazos o las piernas, o cualquier otro sntoma preocupante.    Gracias por visitar nuestro Departamento de [address]. Fue un placer atenderte [employer].  ---  You were seen for your headache in the emergency department.  You were given medicines which improved your symptoms.  At home, please take Tylenol and ibuprofen for your headache.  You may also take the Reglan we have prescribed you for your headache or any nausea or vomiting that you have.  You may take this with Benadryl for severe headaches but please note that this will make you drowsy.    Check your MyChart online for the results of any tests that had not resulted by the time you left the emergency department.   Follow-up with your primary doctor in 2-3 days regarding your visit.  Follow-up with neurology about your headache.  Return immediately to the emergency department if you experience any of the following: Vision changes, numbness or weakness of your arms or legs, or any other concerning symptoms.    Thank you for visiting our Emergency Department. It was a  pleasure taking care of you today.

## 2023-02-26 NOTE — ED Notes (Signed)
Pt went to MRI.

## 2023-03-02 ENCOUNTER — Encounter: Payer: Self-pay | Admitting: Neurology

## 2023-07-29 NOTE — Progress Notes (Deleted)
 NEUROLOGY CONSULTATION NOTE  Sherry Houston MRN: [id number] DOB: 07/20/1985  Referring provider: Vonita Moss, MD (ED referral) Primary care provider: Georgina Quint, MD  Reason for consult:  migraine  Assessment/Plan:   ***   Subjective:  Sherry Houston is a 38 year old ***-handed female with Factor V Leiden mutation who presents for migraines.   History supplemented by ED and ENT notes.  MRI of brain personally reviewed.  Spanish interpreter present.  Onset:  May 2024.  Increased frequency in September 2024.  In addition, she started experiencing various symptoms at different times.  These include bilateral facial numbness, left greater than right upper extremity numbness and weakness, blurred vision/double vision, dizziness, and tinnitus.  No facial droop, speech disturbance or gait instability.  Seen in the ED on 02/26/2023.  MRI of brain with and without contrast was normal.  Diagnosed with complicated migraines.  She saw ENT on 03/06/2023.  Exam and audiometric testing was normal.   .    Past NSAIDS/analgesics:  *** Past abortive triptans:  *** Past abortive ergotamine:  *** Past muscle relaxants:  *** Past anti-emetic:  *** Past antihypertensive medications:  *** Past antidepressant medications:  *** Past anticonvulsant medications:  *** Past anti-CGRP:  *** Past vitamins/Herbal/Supplements:  *** Past antihistamines/decongestants:  *** Other past therapies:  ***  Current NSAIDS/analgesics:  *** Current triptans:  *** Current ergotamine:  *** Current anti-emetic:  Reglan 10mg  Current muscle relaxants:  *** Current Antihypertensive medications:  *** Current Antidepressant medications:  nortriptyline 10mg  at bedtime Current Anticonvulsant medications:  *** Current anti-CGRP:  *** Current Vitamins/Herbal/Supplements:  *** Current Antihistamines/Decongestants:  *** Other therapy:  *** Birth control:  *** Other medications:  ***   Caffeine:   *** Alcohol:  *** Smoker:  *** Diet:  *** Exercise:  *** Depression:  ***; Anxiety:  *** Other pain:  *** Sleep hygiene:  *** Family history of headache:  ***      PAST MEDICAL HISTORY: Past Medical History:  Diagnosis Date   Factor 5 Leiden mutation, heterozygous (HCC) 09/16/2017   Obstetrical blood clot embolism in second trimester 2014   LLE. Diagnosed in first pregnancy    PAST SURGICAL HISTORY: Past Surgical History:  Procedure Laterality Date   INCISE AND DRAIN ABCESS     Breast   NO PAST SURGERIES      MEDICATIONS: Current Outpatient Medications on File Prior to Visit  Medication Sig Dispense Refill   metoCLOPramide (REGLAN) 10 MG tablet Take 1 tablet (10 mg total) by mouth every 6 (six) hours as needed for nausea or vomiting (Headache). 15 tablet 0   No current facility-administered medications on file prior to visit.    ALLERGIES: No Known Allergies  FAMILY HISTORY: Family History  Problem Relation Age of Onset   Heart failure Sister     Objective:  *** General: No acute distress.  Patient appears well-groomed.   Head:  Normocephalic/atraumatic Eyes:  fundi examined but not visualized Neck: supple, no paraspinal tenderness, full range of motion Back: No paraspinal tenderness Heart: regular rate and rhythm Vascular: No carotid bruits. Neurological Exam: Mental status: alert and oriented to person, place, and time, speech fluent and not dysarthric, language intact. Cranial nerves: CN I: not tested CN II: pupils equal, round and reactive to light, visual fields intact CN III, IV, VI:  full range of motion, no nystagmus, no ptosis CN V: facial sensation intact. CN VII: upper and lower face symmetric CN VIII: hearing intact CN IX, X:  gag intact, uvula midline CN XI: sternocleidomastoid and trapezius muscles intact CN XII: tongue midline Bulk & Tone: normal, no fasciculations. Motor:  muscle strength 5/5 throughout Sensation:  Pinprick,  temperature and vibratory sensation intact. Deep Tendon Reflexes:  2+ throughout,  toes downgoing.   Finger to nose testing:  Without dysmetria.   Gait:  Normal station and stride.  Romberg negative.    Thank you for allowing me to take part in the care of this patient.  Shon Millet, DO  CC: Georgina Quint, MD

## 2023-07-30 ENCOUNTER — Ambulatory Visit: Payer: BLUE CROSS/BLUE SHIELD | Admitting: Neurology
